# Patient Record
Sex: Male | Born: 1945
Health system: Southern US, Community
[De-identification: ages and names within clinical notes are randomized; demographics above are authoritative.]

## PROBLEM LIST (undated history)

## (undated) DIAGNOSIS — I4510 Unspecified right bundle-branch block: Secondary | ICD-10-CM

## (undated) DIAGNOSIS — C61 Malignant neoplasm of prostate: Secondary | ICD-10-CM

## (undated) DIAGNOSIS — K219 Gastro-esophageal reflux disease without esophagitis: Secondary | ICD-10-CM

## (undated) DIAGNOSIS — I10 Essential (primary) hypertension: Secondary | ICD-10-CM

## (undated) DIAGNOSIS — S42101A Fracture of unspecified part of scapula, right shoulder, initial encounter for closed fracture: Secondary | ICD-10-CM

## (undated) DIAGNOSIS — R1310 Dysphagia, unspecified: Secondary | ICD-10-CM

## (undated) DIAGNOSIS — G4730 Sleep apnea, unspecified: Secondary | ICD-10-CM

## (undated) DIAGNOSIS — R51 Headache: Secondary | ICD-10-CM

## (undated) DIAGNOSIS — R972 Elevated prostate specific antigen [PSA]: Secondary | ICD-10-CM

## (undated) DIAGNOSIS — M1990 Unspecified osteoarthritis, unspecified site: Secondary | ICD-10-CM

## (undated) DIAGNOSIS — L84 Corns and callosities: Secondary | ICD-10-CM

## (undated) DIAGNOSIS — R748 Abnormal levels of other serum enzymes: Secondary | ICD-10-CM

## (undated) DIAGNOSIS — E785 Hyperlipidemia, unspecified: Secondary | ICD-10-CM

## (undated) HISTORY — DX: Elevated prostate specific antigen (PSA): R97.20

## (undated) HISTORY — DX: Sleep apnea, unspecified: G47.30

## (undated) HISTORY — DX: Fracture of unspecified part of scapula, right shoulder, initial encounter for closed fracture: S42.101A

## (undated) HISTORY — DX: Abnormal levels of other serum enzymes: R74.8

## (undated) HISTORY — DX: Essential (primary) hypertension: I10

## (undated) HISTORY — DX: Corns and callosities: L84

## (undated) HISTORY — DX: Unspecified right bundle-branch block: I45.10

## (undated) HISTORY — PX: OTHER SURGICAL HISTORY: SHX169

## (undated) HISTORY — DX: Dysphagia, unspecified: R13.10

## (undated) HISTORY — DX: Hyperlipidemia, unspecified: E78.5

## (undated) HISTORY — DX: Headache: R51

## (undated) HISTORY — DX: Gastro-esophageal reflux disease without esophagitis: K21.9

## (undated) HISTORY — PX: PROSTATE BIOPSY: SHX241

---

## 1983-08-21 HISTORY — PX: OTHER SURGICAL HISTORY: SHX169

## 1993-08-20 HISTORY — PX: OTHER SURGICAL HISTORY: SHX169

## 1998-07-18 ENCOUNTER — Ambulatory Visit (HOSPITAL_COMMUNITY): Admission: RE | Admit: 1998-07-18 | Discharge: 1998-07-18 | Payer: Self-pay | Admitting: Orthopedic Surgery

## 2001-08-20 HISTORY — PX: OTHER SURGICAL HISTORY: SHX169

## 2002-04-07 ENCOUNTER — Encounter (INDEPENDENT_AMBULATORY_CARE_PROVIDER_SITE_OTHER): Payer: Self-pay | Admitting: *Deleted

## 2002-04-07 ENCOUNTER — Ambulatory Visit (HOSPITAL_COMMUNITY): Admission: RE | Admit: 2002-04-07 | Discharge: 2002-04-07 | Payer: Self-pay | Admitting: *Deleted

## 2005-11-02 ENCOUNTER — Ambulatory Visit (HOSPITAL_COMMUNITY): Admission: RE | Admit: 2005-11-02 | Discharge: 2005-11-02 | Payer: Self-pay | Admitting: *Deleted

## 2006-07-21 ENCOUNTER — Emergency Department (HOSPITAL_COMMUNITY): Admission: EM | Admit: 2006-07-21 | Discharge: 2006-07-21 | Payer: Self-pay | Admitting: Emergency Medicine

## 2006-07-21 HISTORY — PX: OTHER SURGICAL HISTORY: SHX169

## 2007-04-14 ENCOUNTER — Encounter: Admission: RE | Admit: 2007-04-14 | Discharge: 2007-04-14 | Payer: Self-pay | Admitting: *Deleted

## 2008-05-20 HISTORY — PX: OTHER SURGICAL HISTORY: SHX169

## 2011-01-05 NOTE — Op Note (Signed)
   NAME:  Charles Salas, Charles Salas                            ACCOUNT NO.:  [id number]   MEDICAL RECORD NO.:  [id number]                   PATIENT TYPE:  AMB   LOCATION:  ENDO                                 FACILITY:  [facility]   PHYSICIAN:  Georgiana Spinner, M.D.                 DATE OF BIRTH:  1946/03/24   DATE OF PROCEDURE:  DATE OF DISCHARGE:                                 OPERATIVE REPORT   PROCEDURE:  Colonoscopy.   INDICATIONS:  Colon polyp.   ANESTHESIA:  Demerol 80 mg, Versed 8 mg.   DESCRIPTION OF PROCEDURE:  With the patient mildly sedated in the left  lateral decubitus position, the rectal examination was performed.  Subsequently the Olympus videoscopic colonoscope was inserted in the rectum  and passed under direct vision to the cecum, identified by the ileocecal  valve and appendiceal orifice.  There was a questionable polyp seen in his  cecum, which was photographed and removed using hot biopsy forceps  technique, setting on ERBE.  The endoscope was then withdrawn, taking  circumferential views of the entire colonic mucosa, stopping only in the  rectum, which appeared normal on direct and showed hemorrhoids on retroflex  view.  The endoscope was straightened and withdrawn.  The patient's vital  signs and pulse oximetry remained stable.  The patient tolerated the  procedure well without apparent complications.   FINDINGS:  1. Question of polyp of cecum, removed.  2. Internal hemorrhoids.  3. There were diverticula seen in the sigmoid colon.   PLAN:  Await biopsy report.  The patient will call me for the results and  follow up with me as an outpatient.                                                Georgiana Spinner, M.D.    GMO/MEDQ  D:  04/07/2002  T:  04/08/2002  Job:  [id number]

## 2011-01-05 NOTE — Op Note (Signed)
NAME:  Charles Salas, Charles Salas NO.:  [id number]   MEDICAL RECORD NO.:  [id number]          PATIENT TYPE:  AMB   LOCATION:  ENDO                         FACILITY:  [facility]   PHYSICIAN:  Georgiana Spinner, M.D.    DATE OF BIRTH:  1946/05/07   DATE OF PROCEDURE:  11/02/2005  DATE OF DISCHARGE:                                 OPERATIVE REPORT   PROCEDURE:  Colonoscopy.   ENDOSCOPIST:  Georgiana Spinner, M.D.   INDICATIONS:  Colon polyps.   ANESTHESIA:  Demerol 80 mg, Versed 9 mg.   PROCEDURE:  With the patient mildly sedated in the left lateral decubitus  position, the Olympus videoscopic colonoscope was inserted in the rectum and  passed under direct vision after normal rectal examination to the cecum.  The cecum was identified by ileocecal valve and appendiceal orifice, both  which were photographed.  From this point, the colonoscope was slowly  withdrawn, taking circumferential views of the colonic mucosa, stopping in  the rectum, which appeared normal on direct and showed hemorrhoids on  retroflexed view.  The endoscope was straightened and withdrawn.  The  patient's vital signs and pulse oximetry remained stable.  The patient  tolerated the procedure well without apparent complications.   FINDINGS:  Internal hemorrhoids, otherwise an unremarkable colonoscopic  examination to the cecum.   PLAN:  Have the patient follow up with me in 5 years or as needed.           ______________________________  Georgiana Spinner, M.D.     GMO/MEDQ  D:  11/02/2005  T:  11/03/2005  Job:  [id number]

## 2011-01-05 NOTE — Consult Note (Signed)
NAME:  Charles Salas, Charles Salas NO.:  [id number]   MEDICAL RECORD NO.:  [id number]          PATIENT TYPE:  EMS   LOCATION:  ED                           FACILITY:  [facility]   PHYSICIAN:  Madelynn Done, MD  DATE OF BIRTH:  1945/12/07   DATE OF CONSULTATION:  07/21/2006  DATE OF DISCHARGE:                                 CONSULTATION   REQUESTING PHYSICIAN:  Dr. Effie Shy, emergency department.   REASON FOR CONSULTATION:  Right middle finger distal tip amputation.   HISTORY:  Mr. Puskas is a 65 year old left-hand-dominant gentleman who  sustained a crush injury to the distal tip of his left middle finger  when a garage door came down on his hand.  The patient was wearing a  glove.  The patient initially went to an urgent care and transferred  himself to the emergency department.  This patient has no other  injuries.  The patient received a tetanus shot 3 years ago for a  laceration to his thumb.   PAST MEDICAL HISTORY:  Denies any major medical illnesses.   PAST SURGICAL HISTORY:  Several knee surgeries and shoulder surgery by  Dr. Simonne Come.   MEDICATIONS:  Prilosec.   ALLERGIES:  None.   SOCIAL HISTORY:  He is a nonsmoker.  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  He is married and lives in [place].  He is an  active Armed forces operational officer.   REVIEW OF SYSTEMS:  No recent illnesses, hospitalizations.   PHYSICAL EXAMINATION:  GENERAL:  He is a healthy-appearing white male in  no acute distress.  VITAL SIGNS:  Reviewed, and he is afebrile.  Normal blood pressure and  heart rate.   On examination of the right hand, he has obvious deformity of the right  middle finger with injury to the distal tip.  There are no other  abnormalities in the right hand, normal muscle tone and sensation to  other digits to light touch.  He is able to flex his right middle finger  DIP joint, fully extends the finger.  Otherwise, his neuromotor sensory  function in the right  hand is normal.  His hand is well-perfused.  The  injury took off the nail plate as well as a small portion of the distal  pulp and phalanx.   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   PROCEDURE:  Treatment options were discussed with the patient, and the  patient elected to proceed with debridement of the skin, subcutaneous  tissue, and bone as well as closure with local advancement flaps of the  distal tip.  The wound was then thoroughly irrigated with 1 lateral of  saline solution with a small tincture of Betadine in a pulsatile  fashion.  After the wound was thoroughly irrigated, it was prepped with  Betadine and then sterilely draped.  Prior to the irrigation and  prepping, 10 mL of 1% lidocaine were then injected into the middle  finger to serve as a local digital block.  He tolerated this procedure  well.  A debridement of the skin and subcutaneous tissue and bone was  then carried out of the distal phalanx.  Just a small portion of the  bone was debrided enough so that the distal phalanx was not exposed.  Two local advancement flaps, both on the radial and ulnar side were then  made in order to close the defect volarly.  The skin was closed with 5-0  chromic suture.  The dog-ears were then trimmed with the 15 blade  scalpel to obtain nice closure of the distal tips.  There was no exposed  bone with good skin and soft tissue covering the nail matrix.  The  nailbed plate had been evulsed off, and a small sterile piece of the  suture package was then placed underneath the eponychial fold to protect  the underlying nail matrix.  Xeroform was then applied over the top of  the finger.  A sterile dressing was then applied.  A Coban wrap and a  small finger splint was then applied.  The patient tolerated the  procedure well.   PLAN:  The patient was given a prescription for Keflex to take for the  next 7 days and was given a prescription for Vicodin  for pain.  He was  given my card and a followup appointment to see me back in the office in  10 days.  He is to keep the bandage on until I see him back and keep it  clean and dry.  The patient's and his wife's questions are answered, and  we will see him back in the office in follow up.      Madelynn Done, MD  Electronically Signed     FWO/MEDQ  D:  07/21/2006  T:  07/22/2006  Job:  [id number]

## 2011-03-26 ENCOUNTER — Ambulatory Visit (HOSPITAL_BASED_OUTPATIENT_CLINIC_OR_DEPARTMENT_OTHER)
Admission: RE | Admit: 2011-03-26 | Discharge: 2011-03-26 | Disposition: A | Discharge status: Home / Self Care | Payer: BC Managed Care – PPO | Source: Ambulatory Visit | Attending: Orthopedic Surgery | Admitting: Orthopedic Surgery

## 2011-03-26 DIAGNOSIS — Z01812 Encounter for preprocedural laboratory examination: Secondary | ICD-10-CM | POA: Insufficient documentation

## 2011-03-26 DIAGNOSIS — M1710 Unilateral primary osteoarthritis, unspecified knee: Secondary | ICD-10-CM | POA: Insufficient documentation

## 2011-03-26 DIAGNOSIS — M23319 Other meniscus derangements, anterior horn of medial meniscus, unspecified knee: Secondary | ICD-10-CM | POA: Insufficient documentation

## 2011-03-26 DIAGNOSIS — Z01810 Encounter for preprocedural cardiovascular examination: Secondary | ICD-10-CM | POA: Insufficient documentation

## 2011-03-26 LAB — POCT HEMOGLOBIN-HEMACUE: Hemoglobin: 16 g/dL (ref 13.0–17.0)

## 2011-04-02 NOTE — Op Note (Signed)
NAME:  Charles Salas, Charles Salas NO.:  [id number]  MEDICAL RECORD NO.:  [id number]  LOCATION:                                 FACILITY:  PHYSICIAN:  Marlowe Kays, M.D.       DATE OF BIRTH:  DATE OF PROCEDURE:  03/26/2011 DATE OF DISCHARGE:                              OPERATIVE REPORT   PREOPERATIVE DIAGNOSES: 1. Recurrent tear of medial meniscus, right knee. 2. Osteoarthritis, medial compartment of right knee.  POSTOPERATIVE DIAGNOSES: 1. Recurrent tear of medial meniscus, right knee. 2. Osteoarthritis, medial compartment of right knee.  OPERATION:  Right knee arthroscopy with partial medial meniscectomy and shaving of the medial femoral condyle.  SURGEON:  Marlowe Kays, M.D.  ASSISTANT:  Nurse.  ANESTHESIA:  General.  PATHOLOGY AND JUSTIFICATION FOR PROCEDURE:  I performed a partial medial meniscectomy on him some 10 years ago.  He plays lot of tennis and does not remember any particular injury, but because of persistent pain and popping in the inner joint, went ahead and had an MRI performed on Dec 23, 2010, which demonstrated the preoperative diagnoses.  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  PROCEDURE:  Satisfied general anesthesia, Ace wrap and knee support to left lower extremity, pneumatic tourniquet applied to right lower extremity with the leg Esmarched out non-sterilely and tourniquet inflated to 3 mmHg thigh, thigh stabilizer applied, right leg was then prepped with DuraPrep, stabilizer, and ankle drape in sterile field. Time-out performed.  Superior medial saline inflow.  First, an anterolateral portal and evaluated medial compartment of the knee joint. 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  The two did appear to be spatially  related.  I debrided down the anterior third of the medial meniscus and removed the fibrotic associated synovium. Also, gently smoothed down the medial femoral condyle.  Posteriorly, he had tearing of the medial meniscus all way from just proximal to posterior curve all way into the intercondylar area, which I trimmed back to stable rim with baskets and a 3.5 shaver.  Then, reversed portals, ACL was intact.  Lateral compartment of the knee joint looked normal.  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  The knee joint was then irrigated until clear and all fluid possibly removed.  I closed the 2 anterior portals with 4-0 nylon and then injected through the inflow apparatus 20 cc of 0.5% with adrenaline, 4 mg of morphine, closing this portal with 4-0 nylon as well.  Betadine, Adaptic, and dry sterile dressing were applied.  Tourniquet was released.  He tolerated the procedure well.  At the time of this dictation, was on his way to recovery room in satisfactory condition with no known complications.          ______________________________ Marlowe Kays, M.D.     JA/MEDQ  D:  03/26/2011  T:  03/27/2011  Job:  [id number]  Electronically Signed by Marlowe Kays M.D. on 04/02/2011 01:11:46 PM

## 2011-05-12 ENCOUNTER — Emergency Department (HOSPITAL_COMMUNITY): Payer: BC Managed Care – PPO

## 2011-05-12 ENCOUNTER — Emergency Department (HOSPITAL_COMMUNITY)
Admission: EM | Admit: 2011-05-12 | Discharge: 2011-05-12 | Disposition: A | Discharge status: Home / Self Care | Payer: BC Managed Care – PPO | Attending: Emergency Medicine | Admitting: Emergency Medicine

## 2011-05-12 DIAGNOSIS — Y9373 Activity, racquet and hand sports: Secondary | ICD-10-CM | POA: Insufficient documentation

## 2011-05-12 DIAGNOSIS — R110 Nausea: Secondary | ICD-10-CM | POA: Insufficient documentation

## 2011-05-12 DIAGNOSIS — S42109A Fracture of unspecified part of scapula, unspecified shoulder, initial encounter for closed fracture: Secondary | ICD-10-CM | POA: Insufficient documentation

## 2011-05-12 DIAGNOSIS — M25519 Pain in unspecified shoulder: Secondary | ICD-10-CM | POA: Insufficient documentation

## 2011-05-12 DIAGNOSIS — K219 Gastro-esophageal reflux disease without esophagitis: Secondary | ICD-10-CM | POA: Insufficient documentation

## 2011-05-12 DIAGNOSIS — W1809XA Striking against other object with subsequent fall, initial encounter: Secondary | ICD-10-CM | POA: Insufficient documentation

## 2011-05-12 DIAGNOSIS — IMO0002 Reserved for concepts with insufficient information to code with codable children: Secondary | ICD-10-CM | POA: Insufficient documentation

## 2011-05-18 ENCOUNTER — Other Ambulatory Visit: Payer: Self-pay | Admitting: Orthopedic Surgery

## 2011-05-18 DIAGNOSIS — M25511 Pain in right shoulder: Secondary | ICD-10-CM

## 2011-05-22 ENCOUNTER — Ambulatory Visit
Admission: RE | Admit: 2011-05-22 | Discharge: 2011-05-22 | Disposition: A | Discharge status: Home / Self Care | Payer: BC Managed Care – PPO | Source: Ambulatory Visit | Attending: Orthopedic Surgery | Admitting: Orthopedic Surgery

## 2011-05-22 DIAGNOSIS — M25511 Pain in right shoulder: Secondary | ICD-10-CM

## 2011-08-21 DIAGNOSIS — S42101A Fracture of unspecified part of scapula, right shoulder, initial encounter for closed fracture: Secondary | ICD-10-CM

## 2011-08-21 HISTORY — DX: Fracture of unspecified part of scapula, right shoulder, initial encounter for closed fracture: S42.101A

## 2012-03-31 ENCOUNTER — Encounter: Payer: Self-pay | Admitting: *Deleted

## 2012-03-31 ENCOUNTER — Encounter: Payer: Self-pay | Admitting: Pulmonary Disease

## 2012-04-01 ENCOUNTER — Encounter: Payer: Self-pay | Admitting: Pulmonary Disease

## 2012-04-01 ENCOUNTER — Ambulatory Visit (INDEPENDENT_AMBULATORY_CARE_PROVIDER_SITE_OTHER): Payer: BC Managed Care – PPO | Admitting: Pulmonary Disease

## 2012-04-01 VITALS — BP 132/96 | HR 69 | Temp 98.5°F | Ht 72.0 in | Wt 194.0 lb

## 2012-04-01 DIAGNOSIS — G4733 Obstructive sleep apnea (adult) (pediatric): Secondary | ICD-10-CM | POA: Insufficient documentation

## 2012-04-01 DIAGNOSIS — R1310 Dysphagia, unspecified: Secondary | ICD-10-CM | POA: Insufficient documentation

## 2012-04-01 NOTE — Progress Notes (Signed)
  Subjective:    Patient ID: Charles Salas, male    DOB: August 15, 1946, 66 y.o.   MRN: [id number]  HPI The patient is a 66 year old male who had been asked to see for management of obstructive sleep apnea.  He recently underwent sleep testing in May of this year, which showed an AHI of 34 events per hour.  The patient has been told that he has loud snoring according to his wife, as well as an abnormal breathing pattern during sleep.  [place], and his symptoms were much worse.  The patient has 1-2 awakenings at night, and is not rested in the mornings upon arising.  He does describe some sleep pressure with periods of inactivity such as watching television, but primarily is not satisfied with his alertness and focus during the day.  He denies any issues with sleepiness while driving.  The patient states that his weight is neutral over the last 2 years, and his Epworth score today is normal at 4.  Sleep Questionnaire: What time do you typically go to bed?( Between what hours) 10-11pm How long does it take you to fall asleep? 10-15 minutes How many times during the night do you wake up? 2 What time do you get out of bed to start your day? 0630 Do you drive or operate heavy machinery in your occupation? No How much has your weight changed (up or down) over the past two years? (In pounds) 3 lb (1.361 kg) Have you ever had a sleep study before? Yes If yes, location of study? [facility] If yes, date of study? Do you currently use CPAP? No Do you wear oxygen at any time? No     Review of Systems  Constitutional: Negative for fever and unexpected weight change.  HENT: Positive for congestion. Negative for ear pain, nosebleeds, sore throat, rhinorrhea, sneezing, trouble swallowing, dental problem, postnasal drip and sinus pressure.   Eyes: Negative for redness and itching.  Respiratory: Negative for cough, chest tightness,  shortness of breath and wheezing.   Cardiovascular: Negative for palpitations and leg swelling.  Gastrointestinal: Negative for nausea and vomiting.       Heartburn   Genitourinary: Negative for dysuria.  Musculoskeletal: Positive for arthralgias. Negative for joint swelling.  Skin: Negative for rash.  Neurological: Negative for headaches.  Hematological: Does not bruise/bleed easily.  Psychiatric/Behavioral: Negative for dysphoric mood. The patient is not nervous/anxious.   All other systems reviewed and are negative.       Objective:   Physical Exam Constitutional:  Well developed, no acute distress  HENT:  Nares patent without discharge  Oropharynx without exudate, palate and uvula are mildly elongated.   Eyes:  Perrla, eomi, no scleral icterus  Neck:  No JVD, no TMG  Cardiovascular:  Normal rate, regular rhythm, no rubs or gallops.  No murmurs        Intact distal pulses  Pulmonary :  Normal breath sounds, no stridor or respiratory distress   No rales, rhonchi, or wheezing  Abdominal:  Soft, nondistended, bowel sounds present.  No tenderness noted.   Musculoskeletal:  No lower extremity edema noted.  Lymph Nodes:  No cervical lymphadenopathy noted  Skin:  No cyanosis noted  Neurologic:  Alert, appropriate, moves all 4 extremities without obvious deficit.         Assessment & Plan:

## 2012-04-01 NOTE — Patient Instructions (Addendum)
Will start on cpap at moderate pressure level.  Please call if having issues with tolerance. Work on modest weight loss followup with me in 6 weeks.

## 2012-04-01 NOTE — Assessment & Plan Note (Signed)
The patient has been found to have moderate to severe obstructive sleep apnea by sleep testing, and he also notes sleep disruption and definite sleep pressure during the day.  I have had a long discussion with him about the pathophysiology of sleep apnea, including its impact to his quality of life and cardiovascular health.  At this point, his best treatment option would be CPAP as well as modest weight loss.  He is willing to give this a try. I will set the patient up on cpap at a moderate pressure level to allow for desensitization, and will troubleshoot the device over the next 4-6weeks if needed.  The pt is to call me if having issues with tolerance.  Will then optimize the pressure once patient is able to wear cpap on a consistent basis.

## 2012-05-13 ENCOUNTER — Ambulatory Visit (INDEPENDENT_AMBULATORY_CARE_PROVIDER_SITE_OTHER): Payer: BC Managed Care – PPO | Admitting: Pulmonary Disease

## 2012-05-13 ENCOUNTER — Encounter: Payer: Self-pay | Admitting: Pulmonary Disease

## 2012-05-13 ENCOUNTER — Ambulatory Visit: Payer: BC Managed Care – PPO | Admitting: Pulmonary Disease

## 2012-05-13 VITALS — BP 118/72 | HR 59 | Temp 98.5°F | Ht 72.0 in | Wt 201.4 lb

## 2012-05-13 DIAGNOSIS — G4733 Obstructive sleep apnea (adult) (pediatric): Secondary | ICD-10-CM

## 2012-05-13 NOTE — Progress Notes (Signed)
  Subjective:    Patient ID: Charles Salas, male    DOB: [birth year], 66 y.o.   MRN: [id number]  HPI The patient comes in today for followup of his known obstructive sleep apnea.  He was started on CPAP at the last visit had a moderate pressure level, and has done fairly well with the device.  He is having some mask leaking issues, and is going to try something different this week.  He feels that he is sleeping better, dreaming more, and feels more rested the next day.  He is having no issues with the CPAP pressure.   Review of Systems  Constitutional: Negative for fever and unexpected weight change.  HENT: Negative for ear pain, nosebleeds, congestion, sore throat, rhinorrhea, sneezing, trouble swallowing, dental problem, postnasal drip and sinus pressure.   Eyes: Negative for redness and itching.  Respiratory: Negative for cough, chest tightness, shortness of breath and wheezing.   Cardiovascular: Negative for palpitations and leg swelling.  Gastrointestinal: Negative for nausea and vomiting.  Genitourinary: Negative for dysuria.  Musculoskeletal: Negative for joint swelling.  Skin: Negative for rash.  Neurological: Negative for headaches.  Hematological: Does not bruise/bleed easily.  Psychiatric/Behavioral: Negative for dysphoric mood. The patient is not nervous/anxious.        Objective:   Physical Exam Well-developed male in no acute distress No skin breakdown or pressure necrosis from the CPAP mask Lower extremities without edema, cyanosis Alert and oriented, does not appear to be sleepy, moves all 4 extremities.       Assessment & Plan:

## 2012-05-13 NOTE — Assessment & Plan Note (Signed)
The patient is doing better since being on CPAP, but is having mask issues that will need to be resolved.  I have asked him to continue with his CPAP, and will take this opportunity to optimize his pressure on the automatic setting.  He will also work on mask fit.  I also encouraged him to work aggressively on weight loss.

## 2012-05-13 NOTE — Patient Instructions (Addendum)
Will send an order to your dme to put your machine on auto to optimize your pressure.  Will let you know the results. Work on [employer] loss Keep working on mask fit followup with me in 6mos.

## 2012-07-13 ENCOUNTER — Other Ambulatory Visit: Payer: Self-pay | Admitting: Pulmonary Disease

## 2012-07-13 DIAGNOSIS — G4733 Obstructive sleep apnea (adult) (pediatric): Secondary | ICD-10-CM

## 2012-11-10 ENCOUNTER — Ambulatory Visit (INDEPENDENT_AMBULATORY_CARE_PROVIDER_SITE_OTHER): Payer: BC Managed Care – PPO | Admitting: Pulmonary Disease

## 2012-11-10 ENCOUNTER — Encounter: Payer: Self-pay | Admitting: Pulmonary Disease

## 2012-11-10 VITALS — BP 142/84 | HR 62 | Temp 97.7°F | Ht 72.0 in | Wt 204.0 lb

## 2012-11-10 DIAGNOSIS — G4733 Obstructive sleep apnea (adult) (pediatric): Secondary | ICD-10-CM

## 2012-11-10 NOTE — Patient Instructions (Addendum)
Continue with cpap, and keep up with mask changes and supplies Work on modest weight loss followup with me in one year if doing well.

## 2012-11-10 NOTE — Progress Notes (Signed)
  Subjective:    Patient ID: Charles Salas, male    DOB: 26-Jan-1946, 67 y.o.   MRN: [id number]  HPI The patient comes in today for followup of his obstructive sleep apnea.  He is wearing CPAP compliantly and has optimized pressure, and feels that he is doing well with the device.  He clearly sees a difference in his sleep and daytime alertness versus the occasional night that he does not wear the device.  He is having no significant mask or pressure issues.  Of note, his weight is up 3 pounds since last visit.   Review of Systems  Constitutional: Negative for fever and unexpected weight change.  HENT: Positive for congestion. Negative for ear pain, nosebleeds, sore throat, rhinorrhea, sneezing, trouble swallowing, dental problem, postnasal drip and sinus pressure.   Eyes: Negative for redness and itching.  Respiratory: Negative for cough, chest tightness, shortness of breath and wheezing.   Cardiovascular: Negative for palpitations and leg swelling.  Gastrointestinal: Negative for nausea and vomiting.  Genitourinary: Negative for dysuria.  Musculoskeletal: Negative for joint swelling.  Skin: Negative for rash.  Neurological: Negative for headaches.  Hematological: Does not bruise/bleed easily.  Psychiatric/Behavioral: Negative for dysphoric mood. The patient is not nervous/anxious.        Objective:   Physical Exam Well-developed male in no acute distress Nose with purulent discharge noted No skin breakdown or pressure necrosis from the CPAP mask Lower extremities without edema, no cyanosis Alert and oriented, does not appear to be sleepy, moves all 4 extremities.       Assessment & Plan:

## 2012-11-10 NOTE — Assessment & Plan Note (Signed)
The patient is doing fairly well with CPAP optimal pressure.  He feels that his sleep and daytime alertness are definitely improved by the device.  I've asked him to keep up with this mask changes and supplies, and to work on modest weight loss.  If he is doing well, I would like to see him back in one year.

## 2012-12-15 ENCOUNTER — Other Ambulatory Visit: Payer: BC Managed Care – PPO

## 2012-12-15 ENCOUNTER — Other Ambulatory Visit: Payer: Self-pay | Admitting: *Deleted

## 2012-12-15 DIAGNOSIS — Z125 Encounter for screening for malignant neoplasm of prostate: Secondary | ICD-10-CM

## 2012-12-15 DIAGNOSIS — E785 Hyperlipidemia, unspecified: Secondary | ICD-10-CM

## 2012-12-15 DIAGNOSIS — M25569 Pain in unspecified knee: Secondary | ICD-10-CM

## 2012-12-15 DIAGNOSIS — K2100 Gastro-esophageal reflux disease with esophagitis, without bleeding: Secondary | ICD-10-CM

## 2012-12-16 LAB — COMPREHENSIVE METABOLIC PANEL
ALT: 15 IU/L (ref 0–44)
AST: 25 IU/L (ref 0–40)
Albumin: 4.5 g/dL (ref 3.6–4.8)
BUN: 12 mg/dL (ref 8–27)
CO2: 24 mmol/L (ref 19–28)
Chloride: 102 mmol/L (ref 97–108)
GFR calc Af Amer: 77 mL/min/{1.73_m2} (ref >59)
GFR calc non Af Amer: 66 mL/min/{1.73_m2} (ref >59)
Globulin, Total: 2 g/dL (ref 1.5–4.5)
Glucose: 86 mg/dL (ref 65–99)
Sodium: 140 mmol/L (ref 134–144)

## 2012-12-16 LAB — CBC WITH DIFFERENTIAL/PLATELET
Basos: 1 % (ref 0–3)
Immature Grans (Abs): 0 10*3/uL (ref 0.0–0.1)
MCH: 32.2 pg (ref 26.6–33.0)
MCHC: 34.1 g/dL (ref 31.5–35.7)
Monocytes Absolute: 0.3 10*3/uL (ref 0.1–0.9)
Monocytes: 8 % (ref 4–12)
Neutrophils Absolute: 1.8 10*3/uL (ref 1.4–7.0)
Neutrophils Relative %: 54 % (ref 40–74)
RBC: 4.91 x10E6/uL (ref 4.14–5.80)
RDW: 14 % (ref 12.3–15.4)
WBC: 3.4 10*3/uL (ref 3.4–10.8)

## 2012-12-16 LAB — LIPID PANEL
Chol/HDL Ratio: 3.5 ratio units (ref 0.0–5.0)
Cholesterol, Total: 187 mg/dL (ref 100–199)

## 2012-12-17 ENCOUNTER — Encounter: Payer: Self-pay | Admitting: Internal Medicine

## 2012-12-17 ENCOUNTER — Encounter: Payer: Self-pay | Admitting: Geriatric Medicine

## 2012-12-17 ENCOUNTER — Ambulatory Visit (INDEPENDENT_AMBULATORY_CARE_PROVIDER_SITE_OTHER): Payer: BC Managed Care – PPO | Admitting: Internal Medicine

## 2012-12-17 VITALS — BP 112/78 | HR 63 | Temp 98.2°F | Resp 16 | Ht 72.0 in | Wt 191.0 lb

## 2012-12-17 DIAGNOSIS — G4733 Obstructive sleep apnea (adult) (pediatric): Secondary | ICD-10-CM

## 2012-12-17 DIAGNOSIS — R1310 Dysphagia, unspecified: Secondary | ICD-10-CM

## 2012-12-17 DIAGNOSIS — K219 Gastro-esophageal reflux disease without esophagitis: Secondary | ICD-10-CM

## 2012-12-17 DIAGNOSIS — E785 Hyperlipidemia, unspecified: Secondary | ICD-10-CM

## 2012-12-17 MED ORDER — VITAMIN D 1000 UNITS PO CAPS: 1000.0000 [IU] | ORAL_CAPSULE | Freq: Every day | ORAL | Status: DC

## 2012-12-17 NOTE — Patient Instructions (Signed)
Continue current medications. 

## 2013-03-01 ENCOUNTER — Encounter: Payer: Self-pay | Admitting: Internal Medicine

## 2013-03-01 DIAGNOSIS — K219 Gastro-esophageal reflux disease without esophagitis: Secondary | ICD-10-CM | POA: Insufficient documentation

## 2013-03-01 DIAGNOSIS — G4733 Obstructive sleep apnea (adult) (pediatric): Secondary | ICD-10-CM | POA: Insufficient documentation

## 2013-03-01 DIAGNOSIS — G4730 Sleep apnea, unspecified: Secondary | ICD-10-CM | POA: Insufficient documentation

## 2013-03-01 DIAGNOSIS — E785 Hyperlipidemia, unspecified: Secondary | ICD-10-CM | POA: Insufficient documentation

## 2013-03-01 NOTE — Progress Notes (Signed)
Date: 03/01/2013  MRN:  [id number] Name:  Charles Salas Sex:  male Age:  67 y.o. DOB:03-28-1946                  Level Of Care: independent Provider: Murray Hodgkins, MD  Emergency Contacts: Contact Information   Name Relation Home Work Mobile   Asiah, [place]ef[place] [phone]  [phone]      Code Status: full  Allergies: Allergies  Allergen Reactions  . Augmentin (Amoxicillin-Pot Clavulanate)      Chief Complaint  Patient presents with  . Annual Exam    no new problems     HPI: GERD is better. Using Prilosec. Has successfully lost weight. Using Weight Watcher program. Remains active. Plays tennis regularly. Using CPAP for his OSA that was confirmed on sleep study 12/31/11  Past Medical History  Diagnosis Date  . Dysphagia   . Sleep apnea   . Obstructive sleep apnea (adult) (pediatric)   . Special screening for malignant neoplasm of prostate   . Other symptoms involving respiratory system and chest   . Pain in joint, lower leg   . Corns and callosities   . Residual foreign body in soft tissue   . Headache(784.0)   . Hyperlipidemia   . GERD (gastroesophageal reflux disease)   . Diaphragmatic hernia without mention of obstruction or gangrene   . Fracture of scapula, right, closed 2013    Past Surgical History  Procedure Laterality Date  . 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 Melvyn Novas  . Left shoulder surgery Left 2003    shaved collar bone; Dr. Simonne Come  . Left knee arthroscopy Left 1985    Dr. Simonne Come  . Right knee arthroscopy Right 1009    Dr. Simonne Come  . Left knee arthroscopy Left 1995    Dr. Simonne Come     Procedures: 1976 CT brain: normal. 2000 EGD Virginia Rochester): Scar of distal esophagus; history of dysphagia 04-07-2002 Colonoscopy-polyps of cecum, internal hemorrhoids, diverticula-Dr. Virginia Rochester 11-02-2005 Colonoscopy-internal hemorrhoids-Dr. Virginia [facility] 07-21-2006 X-ray right middle finger-traumatic amputation involving the distal tuft  of the distal phalanx 10/25/2009 CT of the Brain: Normal nonenhanced CT scan of the brain   12/31/11 Sleep study: confirmed OSA.  Consultants: Dr. Harold Barban Dr.[facility]  Dr. Melvyn Novas: Ortho  Current Outpatient Prescriptions  Medication Sig Dispense Refill  . Multiple Vitamins-Minerals (MULTIVITAMIN WITH MINERALS) tablet Take 1 tablet by mouth daily.      . Omega-3 Fatty Acids (FISH OIL PO) Take 1,800 mg by mouth daily.      Marland Kitchen omeprazole (PRILOSEC) 20 MG capsule Take 20 mg by mouth daily.      . Thiamine HCl (VITAMIN B-1) 250 MG tablet Take 250 mg by mouth daily.      . Cholecalciferol (VITAMIN D) 1000 UNITS capsule Take 1 capsule (1,000 Units total) by mouth daily.       No current facility-administered medications for this visit.    Immunization History  Administered Date(s) Administered  . Influenza Split 05/21/2011, 06/20/2012  . Zoster 05/21/2011     Diet: Regular  History  Substance Use Topics  . Smoking status: Never Smoker   . Smokeless tobacco: Not on file  . Alcohol Use: Yes     Comment: 5-6 drinks per week    Family History  Problem Relation Age of Onset  . Prostate cancer Father   . Diabetes Father   . Diabetes Mother   . Stroke Mother     Review of systems DATA  OBTAINED: from patient GENERAL: Feels well   No fevers, fatigue, change in appetite or weight SKIN: No itch, rash or open wounds EYES: No eye pain, dryness or itching  No change in vision EARS: No earache, tinnitus, change in hearing NOSE: No congestion, drainage or bleeding MOUTH/THROAT: No mouth or tooth pain  No sore throat No difficulty chewing or swallowing RESPIRATORY: No cough, wheezing, SOB CARDIAC: No chest pain, palpitations  No edema. CHEST/BREASTS: No discomfort, discharge or lumps in breasts GI: No abdominal pain  No N/V/D or constipation  No heartburn or reflux  GU: No dysuria, frequency or urgency  No change in urine volume or character No nocturia or  change in stream   MUSCULOSKELETAL: No joint pain, swelling or stiffness  No back pain  No muscle ache, pain, weakness  Gait is steady  No recent falls. Bilateral Dupuytrens contracture. NEUROLOGIC: No dizziness, fainting, headache, imbalance, numbness  No change in mental status.  PSYCHIATRIC: No feelings of anxiety, depression Sleeps well.  No behavior issue.    Vital signs: BP 112/78  Pulse 63  Temp(Src) 98.2 F (36.8 C) (Oral)  Resp 16  Ht 6' (1.829 m)  Wt 191 lb (86.637 kg)  BMI 25.9 kg/m2  SpO2 99%  GENERAL APPEARANCE: No acute distress, appropriately groomed, normal body habitus. Alert, pleasant, conversant. SKIN: No diaphoresis rash, unusual lesions, wounds HEAD: Normocephalic, atraumatic EYES: Conjunctiva/lids clear. Pupils round, reactive. EOMs intact.  EARS: External exam WNL, canals clear, TM WNL. Hearing grossly normal. NOSE: No deformity or discharge. MOUTH/THROAT: Lips w/o lesions. Oral mucosa, tongue moist, w/o lesion. Oropharynx w/o redness or lesions.  NECK: Supple, full ROM. No thyroid tenderness, enlargement or nodule LYMPHATICS: No head, neck or supraclavicular adenopathy RESPIRATORY: Breathing is even, unlabored. Lung sounds are clear and full.  CARDIOVASCULAR: Heart RRR. No murmur or extra heart sounds  ARTERIAL: No carotid, aortic or femoral bruit. Carotid, Femoral, Popliteal, DP,PT pulse 2+.  VENOUS: No varicosities. No venous stasis skin changes  EDEMA: No peripheral or periorbital edema. No ascites GASTROINTESTINAL: Abdomen is soft, non-tender, not distended w/ normal bowel sounds. No hepatic or splenic enlargement. No mass, ventral or inguinal hernia.  RECTAL: No anal fissure, skin tag or external hemorrhoid. Sphincter tone WNL. Stool is brown, heme negative. Prostate normal. GENITOURINARY: Bladder non tender, not distended.  MALE: No penile lesion or drainage. No scrotal edema, tenderness or mass. MUSCULOSKELETAL: Moves all extremities with full ROM,  strength and tone. Back is without kyphosis, scoliosis or spinal process tenderness. Gait is steady NEUROLOGIC: Oriented to time, place, person. Cranial nerves 2-12 grossly intact, speech clear, no tremor. Patella, brachial DTR 2+. PSYCHIATRIC: Mood and affect appropriate to situation    Lab reports 12/07/2010 CBC Wbc 3.0 Rbc 4.75 Hemoglobin 15.1  CMP Glucose 81 Bun 12 creatinine 0.95  Lipid Panel Cholesterol 171 Triglycerides 89 Hdl 53 Ldl 100  HA1C 5.5  PSA 3.7  TSH 1.310  Vitamin D 25 Hydroxy 45.7  11/08/2011 CBC; WBC 3.6, RBC 4.59, HGB 14.7 CMP; Glucose 79, BUN 12, Creatinine 0.92 Lipid Panel; Cholesterol 181, Triglycerides 101, LDL 112, HDL 49, TSH 1.850, Vit D 31.5 Appointment on 12/15/2012  Component Date Value Range Status  . Glucose 12/15/2012 86  65 - 99 mg/dL Final  . BUN 16/05/9603 12  8 - 27 mg/dL Final  . Creatinine, Ser 12/15/2012 1.16  0.76 - 1.27 mg/dL Final  . GFR calc non Af Amer 12/15/2012 66  >59 mL/min/1.73 Final  . GFR calc Af Denyse Dago 12/15/2012  77  >59 mL/min/1.73 Final  . BUN/Creatinine Ratio 12/15/2012 10  10 - 22 Final  . Sodium 12/15/2012 140  134 - 144 mmol/L Final  . Potassium 12/15/2012 4.8  3.5 - 5.2 mmol/L Final  . Chloride 12/15/2012 102  97 - 108 mmol/L Final  . CO2 12/15/2012 24  19 - 28 mmol/L Final  . Calcium 12/15/2012 10.0  8.6 - 10.2 mg/dL Final  . Total Protein 12/15/2012 6.5  6.0 - 8.5 g/dL Final  . Albumin 40/98/1191 4.5  3.6 - 4.8 g/dL Final  . Globulin, Total 12/15/2012 2.0  1.5 - 4.5 g/dL Final  . Albumin/Globulin Ratio 12/15/2012 2.3  1.1 - 2.5 Final  . Total Bilirubin 12/15/2012 0.5  0.0 - 1.2 mg/dL Final  . Alkaline Phosphatase 12/15/2012 73  39 - 117 IU/L Final  . AST 12/15/2012 25  0 - 40 IU/L Final  . ALT 12/15/2012 15  0 - 44 IU/L Final  . Cholesterol, Total 12/15/2012 187  100 - 199 mg/dL Final  . Triglycerides 12/15/2012 98  0 - 149 mg/dL Final  . HDL 47/82/9562 54  >39 mg/dL Final   Comment: According to ATP-III Guidelines,  HDL-C >59 mg/dL is considered a                          negative risk factor for CHD.  Marland Kitchen VLDL Cholesterol Cal 12/15/2012 20  5 - 40 mg/dL Final  . LDL Calculated 12/15/2012 130* 0 - 99 mg/dL Final  . Chol/HDL Ratio 12/15/2012 3.5  0.0 - 5.0 ratio units Final  . PSA 12/15/2012 3.9  0.0 - 4.0 ng/mL Final   Comment: Roche ECLIA methodology.                          According to the American Urological Association, Serum PSA should                          decrease and remain at undetectable levels after radical                          prostatectomy. The AUA defines biochemical recurrence as an initial                          PSA value 0.2 ng/mL or greater followed by a subsequent confirmatory                          PSA value 0.2 ng/mL or greater.                          Values obtained with different assay methods or kits cannot be used                          interchangeably. Results cannot be interpreted as absolute evidence                          of the presence or absence of malignant disease.  . WBC 12/15/2012 3.4  3.4 - 10.8 x10E3/uL Final  . RBC 12/15/2012 4.91  4.14 - 5.80 x10E6/uL Final  . Hemoglobin 12/15/2012 15.8  12.6 - 17.7 g/dL Final  . HCT 86/57/8469  46.4  37.5 - 51.0 % Final  . MCV 12/15/2012 95  79 - 97 fL Final  . MCH 12/15/2012 32.2  26.6 - 33.0 pg Final  . MCHC 12/15/2012 34.1  31.5 - 35.7 g/dL Final  . RDW 16/05/9603 14.0  12.3 - 15.4 % Final  . Neutrophils Relative % 12/15/2012 54  40 - 74 % Final  . Lymphs 12/15/2012 36  14 - 46 % Final  . Monocytes 12/15/2012 8  4 - 12 % Final  . Eos 12/15/2012 1  0 - 5 % Final  . Basos 12/15/2012 1  0 - 3 % Final  . Neutrophils Absolute 12/15/2012 1.8  1.4 - 7.0 x10E3/uL Final  . Lymphocytes Absolute 12/15/2012 1.2  0.7 - 3.1 x10E3/uL Final  . Monocytes Absolute 12/15/2012 0.3  0.1 - 0.9 x10E3/uL Final  . Eosinophils Absolute 12/15/2012 0.0  0.0 - 0.4 x10E3/uL Final  . Basophils Absolute 12/15/2012 0.0  0.0 - 0.2  x10E3/uL Final  . Immature Granulocytes 12/15/2012 0  0 - 2 % Final  . Immature Grans (Abs) 12/15/2012 0.0  0.0 - 0.1 x10E3/uL Final     Screening Score  MMS    PHQ2 0  PHQ9     Fall Risk    BIMS    Annual summary:  Infection History: none Functional assessment: independent in all ADL Areas of potential improvement: functioning at highest acheivable level Rehabilitation Potential: not pertinent Prognosis for survival: good  Plan: Hyperlipidemia: controlled  GERD (gastroesophageal reflux disease): improved  OSA (obstructive sleep apnea): stable on CPAP  Dysphagia: resoloved

## 2013-06-23 ENCOUNTER — Encounter: Payer: Self-pay | Admitting: *Deleted

## 2013-12-16 ENCOUNTER — Other Ambulatory Visit: Payer: Self-pay | Admitting: *Deleted

## 2013-12-16 DIAGNOSIS — E785 Hyperlipidemia, unspecified: Secondary | ICD-10-CM

## 2013-12-16 DIAGNOSIS — Z0000 Encounter for general adult medical examination without abnormal findings: Secondary | ICD-10-CM

## 2013-12-17 ENCOUNTER — Other Ambulatory Visit: Payer: Commercial Managed Care - HMO

## 2013-12-17 DIAGNOSIS — E785 Hyperlipidemia, unspecified: Secondary | ICD-10-CM

## 2013-12-17 DIAGNOSIS — Z0000 Encounter for general adult medical examination without abnormal findings: Secondary | ICD-10-CM

## 2013-12-18 LAB — LIPID PANEL
Chol/HDL Ratio: 3.6 ratio units (ref 0.0–5.0)
Cholesterol, Total: 203 mg/dL — ABNORMAL HIGH (ref 100–199)
HDL: 56 mg/dL (ref >39)
LDL Calculated: 126 mg/dL — ABNORMAL HIGH (ref 0–99)
Triglycerides: 103 mg/dL (ref 0–149)
VLDL Cholesterol Cal: 21 mg/dL (ref 5–40)

## 2013-12-18 LAB — COMPREHENSIVE METABOLIC PANEL
ALK PHOS: 65 IU/L (ref 39–117)
ALT: 16 IU/L (ref 0–44)
AST: 25 IU/L (ref 0–40)
Albumin/Globulin Ratio: 2.4 (ref 1.1–2.5)
Albumin: 4.5 g/dL (ref 3.6–4.8)
BILIRUBIN TOTAL: 0.5 mg/dL (ref 0.0–1.2)
BUN / CREAT RATIO: 23 — AB (ref 10–22)
BUN: 24 mg/dL (ref 8–27)
CALCIUM: 9.5 mg/dL (ref 8.6–10.2)
CO2: 20 mmol/L (ref 18–29)
Chloride: 103 mmol/L (ref 97–108)
Creatinine, Ser: 1.05 mg/dL (ref 0.76–1.27)
GFR calc Af Amer: 86 mL/min/{1.73_m2} (ref >59)
GFR, EST NON AFRICAN AMERICAN: 74 mL/min/{1.73_m2} (ref >59)
Globulin, Total: 1.9 g/dL (ref 1.5–4.5)
Glucose: 88 mg/dL (ref 65–99)
POTASSIUM: 5.2 mmol/L (ref 3.5–5.2)
Sodium: 142 mmol/L (ref 134–144)
TOTAL PROTEIN: 6.4 g/dL (ref 6.0–8.5)

## 2013-12-18 LAB — CBC WITH DIFFERENTIAL/PLATELET
BASOS ABS: 0 10*3/uL (ref 0.0–0.2)
Basos: 1 %
EOS: 2 %
Eosinophils Absolute: 0.1 10*3/uL (ref 0.0–0.4)
HEMATOCRIT: 43.3 % (ref 37.5–51.0)
Hemoglobin: 15.2 g/dL (ref 12.6–17.7)
Immature Grans (Abs): 0 10*3/uL (ref 0.0–0.1)
Immature Granulocytes: 0 %
LYMPHS: 37 %
Lymphocytes Absolute: 1.3 10*3/uL (ref 0.7–3.1)
MCH: 32.8 pg (ref 26.6–33.0)
MCHC: 35.1 g/dL (ref 31.5–35.7)
MCV: 93 fL (ref 79–97)
MONOCYTES: 12 %
Monocytes Absolute: 0.4 10*3/uL (ref 0.1–0.9)
NEUTROS PCT: 48 %
Neutrophils Absolute: 1.7 10*3/uL (ref 1.4–7.0)
RBC: 4.64 x10E6/uL (ref 4.14–5.80)
RDW: 14 % (ref 12.3–15.4)
WBC: 3.6 10*3/uL (ref 3.4–10.8)

## 2013-12-18 LAB — PSA: PSA: 3.9 ng/mL (ref 0.0–4.0)

## 2013-12-23 ENCOUNTER — Encounter: Payer: Self-pay | Admitting: Internal Medicine

## 2013-12-23 ENCOUNTER — Ambulatory Visit (INDEPENDENT_AMBULATORY_CARE_PROVIDER_SITE_OTHER): Payer: Commercial Managed Care - HMO | Admitting: Internal Medicine

## 2013-12-23 VITALS — HR 70 | Temp 99.2°F | Resp 18 | Ht 72.64 in | Wt 193.2 lb

## 2013-12-23 DIAGNOSIS — E785 Hyperlipidemia, unspecified: Secondary | ICD-10-CM

## 2013-12-23 DIAGNOSIS — K219 Gastro-esophageal reflux disease without esophagitis: Secondary | ICD-10-CM

## 2013-12-23 DIAGNOSIS — R1310 Dysphagia, unspecified: Secondary | ICD-10-CM

## 2013-12-23 DIAGNOSIS — Z125 Encounter for screening for malignant neoplasm of prostate: Secondary | ICD-10-CM

## 2013-12-23 DIAGNOSIS — G4733 Obstructive sleep apnea (adult) (pediatric): Secondary | ICD-10-CM

## 2013-12-23 DIAGNOSIS — Z79899 Other long term (current) drug therapy: Secondary | ICD-10-CM

## 2013-12-23 DIAGNOSIS — J209 Acute bronchitis, unspecified: Secondary | ICD-10-CM | POA: Insufficient documentation

## 2013-12-23 MED ORDER — DOXYCYCLINE HYCLATE 100 MG PO TABS: ORAL_TABLET | ORAL | Status: DC

## 2013-12-23 NOTE — Progress Notes (Addendum)
Patient ID: Charles Salas, male   DOB: 1946/05/30, 68 y.o.   MRN: [id number]    Location:    [facility]  Place of Service:  OFFICE  PCP: Estill Dooms, MD  Code Status: Full  Extended Emergency Contact Information Primary Emergency Contact: Hagenow,Pamela E Address: [address]          [place], [place] [address] [place] Phone: [phone] Mobile Phone: [phone] Relation: Spouse  Allergies  Allergen Reactions  . Augmentin [Amoxicillin-Pot Clavulanate]     Chief Complaint  Patient presents with  . Annual Exam    HPI:  Presents today for his annual exam, but is also acutely ill with respiratory infection. Ran fever up to 101 last night. Coughing and feels exhausted.  Hyperlipidemia -controlled  Dysphagia: history of HH. Food seems to hang in throat sometimes.  GERD (gastroesophageal reflux disease): occ, heartburn. Using Tums prn  Obstructive sleep apnea (adult) (pediatric): continues on CPAP    Past Medical History  Diagnosis Date  . Dysphagia   . Sleep apnea   . Obstructive sleep apnea (adult) (pediatric)   . Special screening for malignant neoplasm of prostate   . Other symptoms involving respiratory system and chest   . Pain in joint, lower leg   . Corns and callosities   . Residual foreign body in soft tissue   . Headache(784.0)   . Hyperlipidemia   . GERD (gastroesophageal reflux disease)   . Diaphragmatic hernia without mention of obstruction or gangrene   . Fracture of scapula, right, closed 2013    Past Surgical History  Procedure Laterality Date  . 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 Caralyn Guile  . Left shoulder surgery Left 2003    shaved collar bone; Dr. Shellia Carwin  . Left knee arthroscopy Left 1985    Dr. Shellia Carwin  . Right knee arthroscopy Right 1009    Dr. Shellia Carwin  . Left knee arthroscopy Left 1995    Dr. Shellia Carwin    CONSULTANTS Dr.  Minda Meo Dr.[facility]  PAST PROCEDURES 1976 CT brain: normal. 2000 EGD Lajoyce Corners): Scar of distal esophagus; history of dysphagia 04-07-2002 Colonoscopy-polyps of cecum, internal hemorrhoids, diverticula-Dr. Lajoyce Corners 11-02-2005 Colonoscopy-internal hemorrhoids-Dr. Lajoyce Corners 07-21-2006 X-ray right middle finger-traumatic amputation involving the distal tuft of the distal phalanx 10/25/2009 CT of the Brain: Normal nonenhanced CT scan of the brain   05/18/11 CT shoulder:Fracture of the body the scapula extends inferior to the glenoid neck. The fracture does not involve the articular surface the glenoid and no other acute abnormality is identified.   Social History: History   Social History  . Marital Status: Married    Spouse Name: N/A    Number of Children: N/A  . Years of Education: N/A   Occupational History  . RETIRED MEDICAL SALES    Social History Main Topics  . Smoking status: Never Smoker   . Smokeless tobacco: None  . Alcohol Use: Yes     Comment: 5-6 drinks per week  . Drug Use: No  . Sexual Activity: None   Other Topics Concern  . None   Social History Narrative  . None    Family History Family Status  Relation Status Death Age  . Father Alive   . Mother Deceased 71  . Sister Alive   . Sister Alive   . Sister Alive    Family History  Problem Relation Age of Onset  . Prostate cancer Father   . Diabetes Father   .  Diabetes Mother   . Stroke Mother      Medications: Patient's Medications  New Prescriptions   No medications on file  Previous Medications   CHOLECALCIFEROL (VITAMIN D) 1000 UNITS CAPSULE    Take 1 capsule (1,000 Units total) by mouth daily.   MULTIPLE VITAMINS-MINERALS (MULTIVITAMIN WITH MINERALS) TABLET    Take 1 tablet by mouth daily.   OMEGA-3 FATTY ACIDS (FISH OIL PO)    Take 1,800 mg by mouth daily.   OMEPRAZOLE (PRILOSEC) 20 MG CAPSULE    Take 20 mg by mouth daily.   THIAMINE HCL (VITAMIN B-1) 250 MG TABLET    Take  250 mg by mouth daily.  Modified Medications   No medications on file  Discontinued Medications   No medications on file    Immunization History  Administered Date(s) Administered  . Influenza Split 05/21/2011, 06/20/2012  . Influenza Whole 06/16/2013  . Zoster 05/21/2011     Review of Systems  Constitutional: Positive for fever, chills, activity change and fatigue. Negative for diaphoresis, appetite change and unexpected weight change.  HENT: Positive for congestion, sinus pressure, sore throat and trouble swallowing. Negative for ear pain, hearing loss, mouth sores, nosebleeds, postnasal drip and rhinorrhea.   Eyes: Positive for visual disturbance (CORRECTIVE LENSES).  Respiratory: Positive for cough and chest tightness. Negative for choking, shortness of breath, wheezing and stridor.   Cardiovascular: Negative for chest pain, palpitations and leg swelling.  Gastrointestinal: Negative for nausea, abdominal pain, diarrhea, constipation and abdominal distention.  Endocrine: Negative.   Genitourinary: Negative.   Musculoskeletal:       Bilateral Dupuytren's  Skin: Negative.   Allergic/Immunologic: Negative.   Neurological: Negative.   Hematological: Negative.   Psychiatric/Behavioral: Negative.       Filed Vitals:   12/23/13 1526  Pulse: 70  Temp: 99.2 F (37.3 C)  TempSrc: Oral  Resp: 18  Height: 6' 0.64" (1.845 m)  Weight: 193 lb 3.2 oz (87.635 kg)  SpO2: 98%   Physical Exam  Constitutional: He is oriented to person, place, and time.  Flushed and acutely ill in appearance  HENT:  Head: Normocephalic and atraumatic.  Right Ear: External ear normal.  Left Ear: External ear normal.  Nose: Nose normal.  Mouth/Throat: Oropharynx is clear and moist. No oropharyngeal exudate.  Eyes: Conjunctivae and EOM are normal. Pupils are equal, round, and reactive to light.  Neck: No JVD present. No tracheal deviation present. No thyromegaly present.  Cardiovascular: Normal  rate, normal heart sounds and intact distal pulses.  Exam reveals no gallop and no friction rub.   No murmur heard. Respiratory: No respiratory distress. He has no wheezes. He has no rales. He exhibits no tenderness.  coughing  Genitourinary: Rectum normal, prostate normal and penis normal. Guaiac negative stool. No penile tenderness.  Musculoskeletal: He exhibits edema. He exhibits no tenderness.  Lymphadenopathy:    He has no cervical adenopathy.  Neurological: He is alert and oriented to person, place, and time. He has normal reflexes. No cranial nerve deficit. Coordination normal.  Skin: No rash noted. No erythema. No pallor.  Psychiatric: He has a normal mood and affect. Judgment and thought content normal.       Labs reviewed: Appointment on 12/17/2013  Component Date Value Ref Range Status  . Glucose 12/17/2013 88  65 - 99 mg/dL Final  . BUN 12/17/2013 24  8 - 27 mg/dL Final  . Creatinine, Ser 12/17/2013 1.05  0.76 - 1.27 mg/dL Final  . GFR calc non  Af Amer 12/17/2013 74  >59 mL/min/1.73 Final  . GFR calc Af Amer 12/17/2013 86  >59 mL/min/1.73 Final  . BUN/Creatinine Ratio 12/17/2013 23* 10 - 22 Final  . Sodium 12/17/2013 142  134 - 144 mmol/L Final  . Potassium 12/17/2013 5.2  3.5 - 5.2 mmol/L Final  . Chloride 12/17/2013 103  97 - 108 mmol/L Final  . CO2 12/17/2013 20  18 - 29 mmol/L Final  . Calcium 12/17/2013 9.5  8.6 - 10.2 mg/dL Final  . Total Protein 12/17/2013 6.4  6.0 - 8.5 g/dL Final  . Albumin 12/17/2013 4.5  3.6 - 4.8 g/dL Final  . Globulin, Total 12/17/2013 1.9  1.5 - 4.5 g/dL Final  . Albumin/Globulin Ratio 12/17/2013 2.4  1.1 - 2.5 Final  . Total Bilirubin 12/17/2013 0.5  0.0 - 1.2 mg/dL Final  . Alkaline Phosphatase 12/17/2013 65  39 - 117 IU/L Final  . AST 12/17/2013 25  0 - 40 IU/L Final  . ALT 12/17/2013 16  0 - 44 IU/L Final  . Cholesterol, Total 12/17/2013 203* 100 - 199 mg/dL Final  . Triglycerides 12/17/2013 103  0 - 149 mg/dL Final  . HDL  12/17/2013 56  >39 mg/dL Final   Comment: According to ATP-III Guidelines, HDL-C >59 mg/dL is considered a                          negative risk factor for CHD.  Marland Kitchen VLDL Cholesterol Cal 12/17/2013 21  5 - 40 mg/dL Final  . LDL Calculated 12/17/2013 126* 0 - 99 mg/dL Final  . Chol/HDL Ratio 12/17/2013 3.6  0.0 - 5.0 ratio units Final   Comment:                                   T. Chol/HDL Ratio                                                                      Men  Women                                                        1/2 Avg.Risk  3.4    3.3                                                            Avg.Risk  5.0    4.4                                                         2X Avg.Risk  9.6    7.1  3X Avg.Risk 23.4   11.0  . WBC 12/17/2013 3.6  3.4 - 10.8 x10E3/uL Final  . RBC 12/17/2013 4.64  4.14 - 5.80 x10E6/uL Final  . Hemoglobin 12/17/2013 15.2  12.6 - 17.7 g/dL Final  . HCT 12/17/2013 43.3  37.5 - 51.0 % Final  . MCV 12/17/2013 93  79 - 97 fL Final  . MCH 12/17/2013 32.8  26.6 - 33.0 pg Final  . MCHC 12/17/2013 35.1  31.5 - 35.7 g/dL Final  . RDW 12/17/2013 14.0  12.3 - 15.4 % Final  . Neutrophils Relative % 12/17/2013 48   Final  . Lymphs 12/17/2013 37   Final  . Monocytes 12/17/2013 12   Final  . Eos 12/17/2013 2   Final  . Basos 12/17/2013 1   Final  . Neutrophils Absolute 12/17/2013 1.7  1.4 - 7.0 x10E3/uL Final  . Lymphocytes Absolute 12/17/2013 1.3  0.7 - 3.1 x10E3/uL Final  . Monocytes Absolute 12/17/2013 0.4  0.1 - 0.9 x10E3/uL Final  . Eosinophils Absolute 12/17/2013 0.1  0.0 - 0.4 x10E3/uL Final  . Basophils Absolute 12/17/2013 0.0  0.0 - 0.2 x10E3/uL Final  . Immature Granulocytes 12/17/2013 0   Final  . Immature Grans (Abs) 12/17/2013 0.0  0.0 - 0.1 x10E3/uL Final  . PSA 12/17/2013 3.9  0.0 - 4.0 ng/mL Final   Comment: Roche ECLIA methodology.                          According to the American  Urological Association, Serum PSA should                          decrease and remain at undetectable levels after radical                          prostatectomy. The AUA defines biochemical recurrence as an initial                          PSA value 0.2 ng/mL or greater followed by a subsequent confirmatory                          PSA value 0.2 ng/mL or greater.                          Values obtained with different assay methods or kits cannot be used                          interchangeably. Results cannot be interpreted as absolute evidence                          of the presence or absence of malignant disease.   12/23/12 EKG: rate 66. NSR. Incomplete RBBB. ST and T abnormalities suggestive of LV strain.  Assessment/Plan 1. Acute bronchitis  Doxycycline 100 bid x 10 d  2. Hyperlipidemia controlled - Lipid panel; Future  3. Dysphagia observe  4. GERD (gastroesophageal reflux disease) Continue Tums for now. Try Zantac or Pepcid if symptoms become more frequent  5. Obstructive sleep apnea (adult) (pediatric) Continue CPap  6. Screening PSA (prostate specific antigen) - PSA; Future  7. Encounter for long-term (current) use of other medications - CMP; Future

## 2013-12-23 NOTE — Patient Instructions (Signed)
Continue medications as listed 

## 2014-01-14 ENCOUNTER — Encounter: Payer: Self-pay | Admitting: Internal Medicine

## 2014-02-02 ENCOUNTER — Encounter: Payer: Self-pay | Admitting: Internal Medicine

## 2014-02-02 ENCOUNTER — Ambulatory Visit (INDEPENDENT_AMBULATORY_CARE_PROVIDER_SITE_OTHER): Payer: Commercial Managed Care - HMO | Admitting: Internal Medicine

## 2014-02-02 VITALS — BP 138/80 | HR 76 | Temp 98.3°F | Resp 18 | Wt 195.6 lb

## 2014-02-02 DIAGNOSIS — S8991XA Unspecified injury of right lower leg, initial encounter: Secondary | ICD-10-CM

## 2014-02-02 DIAGNOSIS — S8990XA Unspecified injury of unspecified lower leg, initial encounter: Secondary | ICD-10-CM

## 2014-02-02 DIAGNOSIS — S99929A Unspecified injury of unspecified foot, initial encounter: Secondary | ICD-10-CM

## 2014-02-02 DIAGNOSIS — S99919A Unspecified injury of unspecified ankle, initial encounter: Secondary | ICD-10-CM

## 2014-02-02 NOTE — Progress Notes (Signed)
Patient ID: Charles Salas, male   DOB: [birth year], 68 y.o.   MRN: [id number]    Chief Complaint  Patient presents with  . Referral    right knee problem   Allergies  Allergen Reactions  . Augmentin [Amoxicillin-Pot Clavulanate]    HPI 68 y/o male patient is seen for acute visit. He injured his right knee in April while playing tennis. He recently was playing tennis again and felt his right knee giving away and now has sharp pain on the posterior aspect of his right knee mainly with standing and movement. He has noted some swelling in right knee He has history of OA and undergone arthroscopic surgeries in his right knee  ROS Denies discoloration of his limb No pain in thigh or groin area Normal breathing No chest pain Has taken aleve once a day for last few days  Past Medical History  Diagnosis Date  . Dysphagia   . Sleep apnea   . Obstructive sleep apnea (adult) (pediatric)   . Special screening for malignant neoplasm of prostate   . Other symptoms involving respiratory system and chest   . Pain in joint, lower leg   . Corns and callosities   . Residual foreign body in soft tissue   . Headache(784.0)   . Hyperlipidemia   . GERD (gastroesophageal reflux disease)   . Diaphragmatic hernia without mention of obstruction or gangrene   . Fracture of scapula, right, closed 2013   Medication reviewed. See [facility]  Physical exam BP 138/80  Pulse 76  Temp(Src) 98.3 F (36.8 C)  Resp 18  Wt 195 lb 9.6 oz (88.724 kg)  SpO2 99%  Review of systems DATA OBTAINED: from patient GENERAL: Feels well   No fevers, fatigue, change in appetite or weight SKIN: No itch, rash or open wounds EYES: No eye pain, dryness or itching  No change in vision EARS: No earache, tinnitus, change in hearing NOSE: No congestion, drainage or bleeding MOUTH/THROAT: No mouth or tooth pain  No sore throat No difficulty chewing or swallowing RESPIRATORY: No cough, wheezing, SOB CARDIAC: No chest pain,  palpitations  No edema. CHEST/BREASTS: No discomfort, discharge or lumps in breasts GI: No abdominal pain  No N/V/D or constipation  No heartburn or reflux   GU: No dysuria, frequency or urgency  No change in urine volume or character No nocturia or change in stream   MUSCULOSKELETAL: No joint pain, swelling or stiffness  No back pain  No muscle ache, pain, weakness  Gait is steady  No recent falls. Bilateral Dupuytrens contracture. NEUROLOGIC: No dizziness, fainting, headache, imbalance, numbness  No change in mental status.   PSYCHIATRIC: No feelings of anxiety, depression Sleeps well.  No behavior issue.    Vital signs: BP 112/78  Pulse 63  Temp(Src) 98.2 F (36.8 C) (Oral)  Resp 16  Ht 6' (1.829 m)  Wt 191 lb (86.637 kg)  BMI 25.9 kg/m2  SpO2 99%  GENERAL APPEARANCE: No acute distress, normal body habitus. Alert, pleasant, conversant. SKIN: No diaphoresis rash, unusual lesions, wounds HEAD: Normocephalic, atraumatic RESPIRATORY: Breathing is even, unlabored. Lung sounds are clear and full.   CARDIOVASCULAR: Heart RRR.  MUSCULOSKELETAL: Moves all extremities. Right knee normal flexion and extension, able to rotate medically and laterally against resistance, has pain in posterolateral area- around lateral collateral ligament area. No effusion noted. Soft tissue swelling present on right side. PSYCHIATRIC: Mood and affect appropriate to situation  Assessment/plan  1. Right knee injury i have concern for  lateral collateral ligament injury given his clinical findings. Advised upon rest, using of brace, advil 600 mg tid for next 5 days to help subside inflammation and prn ice pack. Will provide referral to orthopedic given his hx of knee injuries and OA.  - Ambulatory referral to Orthopedic Surgery

## 2014-04-09 ENCOUNTER — Telehealth: Payer: Self-pay | Admitting: Pulmonary Disease

## 2014-04-09 DIAGNOSIS — G4733 Obstructive sleep apnea (adult) (pediatric): Secondary | ICD-10-CM

## 2014-04-09 NOTE — Telephone Encounter (Signed)
We received a fax from [place] asking for an order for a new mask fot the pt. The pt was last seen 10-2012, so I [id number] to get the pt set for a f/u appt and then I will send a message to [facility] to see if we can send an order for a new mask. Largo Bing, CMA

## 2014-04-09 NOTE — Telephone Encounter (Signed)
Returning a call to the nurse.

## 2014-04-09 NOTE — Telephone Encounter (Signed)
atc line busy wcb 

## 2014-04-12 NOTE — Telephone Encounter (Signed)
[place] with me to send order for new mask .

## 2014-04-12 NOTE — Telephone Encounter (Signed)
Appt set for 04-19-14 for follow-up. Dr. Gwenette Greet please advise if ok to send order for a new mask before pt appt on 8-31. Thanks. [place] Bing, CMA

## 2014-04-12 NOTE — Telephone Encounter (Signed)
Order has been placed for pt to get a new mask prior to her appt with [place] on 8/31.

## 2014-04-19 ENCOUNTER — Encounter (INDEPENDENT_AMBULATORY_CARE_PROVIDER_SITE_OTHER): Payer: Self-pay

## 2014-04-19 ENCOUNTER — Encounter: Payer: Self-pay | Admitting: Pulmonary Disease

## 2014-04-19 ENCOUNTER — Ambulatory Visit (INDEPENDENT_AMBULATORY_CARE_PROVIDER_SITE_OTHER): Payer: Commercial Managed Care - HMO | Admitting: Pulmonary Disease

## 2014-04-19 VITALS — BP 120/70 | HR 61 | Temp 97.0°F | Ht 72.0 in | Wt 190.2 lb

## 2014-04-19 DIAGNOSIS — G4733 Obstructive sleep apnea (adult) (pediatric): Secondary | ICD-10-CM

## 2014-04-19 NOTE — Patient Instructions (Signed)
Continue with cpap, and will send prescription to [place] for your supplies. Keep working on modest weight loss followup with me again in one year.

## 2014-04-19 NOTE — Progress Notes (Signed)
   Subjective:    Patient ID: Charles Salas, male    DOB: 1946/08/13, 68 y.o.   MRN: [id number]  HPI The patient comes in today for followup of his obstructive sleep apnea. He is wearing CPAP compliantly, and is having no issues with his device. He is in need of a new mask and supplies. He feels that he has slept well with the device, and has acceptable daytime alertness.  He has lost 13 pounds since the last visit.   Review of Systems  Constitutional: Negative for fever and unexpected weight change.  HENT: Negative for congestion, dental problem, ear pain, nosebleeds, postnasal drip, rhinorrhea, sinus pressure, sneezing, sore throat and trouble swallowing.   Eyes: Negative for redness and itching.  Respiratory: Negative for cough, chest tightness, shortness of breath and wheezing.   Cardiovascular: Negative for palpitations and leg swelling.  Gastrointestinal: Negative for nausea and vomiting.  Genitourinary: Negative for dysuria.  Musculoskeletal: Negative for joint swelling.  Skin: Negative for rash.  Neurological: Negative for headaches.  Hematological: Does not bruise/bleed easily.  Psychiatric/Behavioral: Negative for dysphoric mood. The patient is not nervous/anxious.        Objective:   Physical Exam Well-developed male in no acute distress Nose without purulence or discharge noted Neck without lymphadenopathy or thyromegaly No skin breakdown or pressure necrosis from the CPAP mask Lower extremities without edema, no cyanosis Alert, does not appear to be sleepy, moves all 4 extremities.       Assessment & Plan:

## 2014-04-19 NOTE — Assessment & Plan Note (Signed)
The patient has done extremely well with CPAP, and feels that his sleep is adequate as well as his daytime alertness. He has lost 13 pounds since last visit, and I've encouraged him to continue with modest weight loss. He also needs to keep up with his mask changes and supplies.

## 2014-09-08 ENCOUNTER — Ambulatory Visit (INDEPENDENT_AMBULATORY_CARE_PROVIDER_SITE_OTHER): Payer: PPO | Admitting: Internal Medicine

## 2014-09-08 ENCOUNTER — Encounter: Payer: Self-pay | Admitting: Internal Medicine

## 2014-09-08 VITALS — BP 140/88 | HR 74 | Temp 98.4°F | Ht 72.0 in | Wt 196.0 lb

## 2014-09-08 DIAGNOSIS — J301 Allergic rhinitis due to pollen: Secondary | ICD-10-CM | POA: Diagnosis not present

## 2014-09-08 DIAGNOSIS — G4733 Obstructive sleep apnea (adult) (pediatric): Secondary | ICD-10-CM | POA: Diagnosis not present

## 2014-09-08 DIAGNOSIS — H65199 Other acute nonsuppurative otitis media, unspecified ear: Secondary | ICD-10-CM | POA: Diagnosis not present

## 2014-09-08 MED ORDER — CEFUROXIME AXETIL 250 MG PO TABS: 250.0000 mg | ORAL_TABLET | Freq: Two times a day (BID) | ORAL | Status: DC

## 2014-09-08 NOTE — Progress Notes (Signed)
Patient ID: JAXXEN VOONG, male   DOB: 1945/09/23, 69 y.o.   MRN: [id number]    Facility  [facility]    Place of Service:   OFFICE   Allergies  Allergen Reactions  . Augmentin [Amoxicillin-Pot Clavulanate]     Chief Complaint  Patient presents with  . Acute Visit    Flu-like symptoms (101 fever, head congestion, headache's and scratchy throat)    HPI:  69 yo male  Seen today for above. He has 4 day hx of  Hx OSA on CPAP. (+) fever with Tmax 101F, post nasal drip, scratchy throat, fatigue, ear pressure/pain b/l, tinnitus b/l, reduced hearing worse than ususal, sinus pressure, nasal congestion, rhinorrhea, cough with dark green sputum pdtn, HA and sleep disturbance. Denies CP, SOB, chills, change in appetite. No sick contacts. Tried OTC advil cold and sinus and uses netti pot. No antihistamine tried. He did take a plane to [place], [place] and then to [place], [place] back to [place] to football games.   Medications: Patient's Medications  New Prescriptions   No medications on file  Previous Medications   MULTIPLE VITAMINS-MINERALS (MULTIVITAMIN WITH MINERALS) TABLET    Take 1 tablet by mouth daily.   OMEGA-3 FATTY ACIDS (FISH OIL PO)    Take 1,800 mg by mouth daily.  Modified Medications   No medications on file  Discontinued Medications   MELOXICAM (MOBIC) 15 MG TABLET    Take 1 tablet by mouth daily.     Review of Systems  As above. All other systems reviewed are negative.  Filed Vitals:   09/08/14 0813  BP: 140/88  Pulse: 74  Temp: 98.4 F (36.9 C)  TempSrc: Oral  Height: 6' (1.829 m)  Weight: 196 lb (88.905 kg)  SpO2: 97%   Body mass index is 26.58 kg/(m^2).  Physical Exam  CONSTITUTIONAL: Looks ill in NAD. Awake, alert and oriented x 3 HEENT: TMs red b/l nonbulging but retracted. Intact TMs. PERRLA. No sinus TTP but boggy tissue texture changes. Nares patent with grey turbinates. Oropharynx cobblestoning and red but no exudate NECK: Supple. Nontender. No palpable cervical or  supraclavicular lymph nodes. CVS: Regular rate without murmur, gallop or rub. LUNGS: CTA b/l no wheezing, rales or rhonchi. EXTREMITIES: No edema b/l.  Labs reviewed: No visits with results within 3 Month(s) from this visit. Latest known visit with results is:    Assessment/Plan    ICD-9-CM ICD-10-CM   1. Acute nonsuppurative otitis media, unspecified laterality 381.00 H65.199   2. Allergic rhinitis due to pollen 477.0 J30.1   3. OSA (obstructive sleep apnea) 327.23 G47.33     --Rx ceftin 250 mg BID x 10 days  --use netti pot prn to keep nose moist  --recommend OTC plain antihistamine daily  --recommend chew gum or suck on hard candy during take off and landing when taking airplanes  --continue prn advil cold/sinus prn fever or pain  --RTO if sx's do not improve or worsen  --f/u with Dr Nyoka Cowden as scheduled   Cordella Register. Perlie Gold  [facility] [address] [place], [place] [address] [phone] Office (Wednesdays and Fridays 8 AM - 5 PM) [phone] Cell (Monday-Friday 8 AM - 5 PM)

## 2014-09-08 NOTE — Patient Instructions (Addendum)
Drink plenty of fluids and rest  Recommend OTC plain claritin, zyrtec or allegra daily  Continue as needed advil for pain and fever  Use netti pot as needed to keep nose moist  Return to office if feeling no better or if symptoms worse  F/u with Dr Nyoka Cowden as scheduled

## 2014-12-27 ENCOUNTER — Other Ambulatory Visit: Payer: PPO

## 2014-12-27 DIAGNOSIS — E785 Hyperlipidemia, unspecified: Secondary | ICD-10-CM

## 2014-12-27 DIAGNOSIS — Z125 Encounter for screening for malignant neoplasm of prostate: Secondary | ICD-10-CM

## 2014-12-28 LAB — COMPREHENSIVE METABOLIC PANEL
A/G RATIO: 1.9 (ref 1.1–2.5)
ALT: 58 IU/L — ABNORMAL HIGH (ref 0–44)
AST: 156 IU/L — ABNORMAL HIGH (ref 0–40)
Albumin: 4.3 g/dL (ref 3.6–4.8)
Alkaline Phosphatase: 68 IU/L (ref 39–117)
BUN/Creatinine Ratio: 15 (ref 10–22)
BUN: 15 mg/dL (ref 8–27)
Bilirubin Total: 0.5 mg/dL (ref 0.0–1.2)
CALCIUM: 9.3 mg/dL (ref 8.6–10.2)
CO2: 23 mmol/L (ref 18–29)
CREATININE: 1 mg/dL (ref 0.76–1.27)
Chloride: 99 mmol/L (ref 97–108)
GFR calc Af Amer: 90 mL/min/{1.73_m2} (ref >59)
GFR, EST NON AFRICAN AMERICAN: 78 mL/min/{1.73_m2} (ref >59)
GLOBULIN, TOTAL: 2.3 g/dL (ref 1.5–4.5)
Glucose: 102 mg/dL — ABNORMAL HIGH (ref 65–99)
Potassium: 4.8 mmol/L (ref 3.5–5.2)
Sodium: 140 mmol/L (ref 134–144)
TOTAL PROTEIN: 6.6 g/dL (ref 6.0–8.5)

## 2014-12-28 LAB — LIPID PANEL
CHOLESTEROL TOTAL: 213 mg/dL — AB (ref 100–199)
Chol/HDL Ratio: 3.7 ratio units (ref 0.0–5.0)
HDL: 58 mg/dL (ref >39)
LDL Calculated: 123 mg/dL — ABNORMAL HIGH (ref 0–99)
Triglycerides: 159 mg/dL — ABNORMAL HIGH (ref 0–149)
VLDL Cholesterol Cal: 32 mg/dL (ref 5–40)

## 2014-12-28 LAB — PSA: PROSTATE SPECIFIC AG, SERUM: 4.8 ng/mL — AB (ref 0.0–4.0)

## 2014-12-29 ENCOUNTER — Encounter: Payer: Self-pay | Admitting: Internal Medicine

## 2014-12-29 ENCOUNTER — Ambulatory Visit (INDEPENDENT_AMBULATORY_CARE_PROVIDER_SITE_OTHER): Payer: PPO | Admitting: Internal Medicine

## 2014-12-29 VITALS — BP 118/76 | HR 74 | Temp 98.2°F | Resp 18 | Ht 72.0 in | Wt 191.2 lb

## 2014-12-29 DIAGNOSIS — R972 Elevated prostate specific antigen [PSA]: Secondary | ICD-10-CM

## 2014-12-29 DIAGNOSIS — K219 Gastro-esophageal reflux disease without esophagitis: Secondary | ICD-10-CM

## 2014-12-29 DIAGNOSIS — R1310 Dysphagia, unspecified: Secondary | ICD-10-CM

## 2014-12-29 DIAGNOSIS — R748 Abnormal levels of other serum enzymes: Secondary | ICD-10-CM | POA: Insufficient documentation

## 2014-12-29 DIAGNOSIS — G4733 Obstructive sleep apnea (adult) (pediatric): Secondary | ICD-10-CM

## 2014-12-29 DIAGNOSIS — E785 Hyperlipidemia, unspecified: Secondary | ICD-10-CM | POA: Diagnosis not present

## 2014-12-29 DIAGNOSIS — Z0000 Encounter for general adult medical examination without abnormal findings: Secondary | ICD-10-CM

## 2014-12-29 HISTORY — DX: Abnormal levels of other serum enzymes: R74.8

## 2014-12-29 HISTORY — DX: Elevated prostate specific antigen (PSA): R97.20

## 2014-12-29 NOTE — Addendum Note (Signed)
Addended by: Estill Dooms on: 12/29/2014 05:34 PM   Modules accepted: Level of Service

## 2014-12-29 NOTE — Progress Notes (Signed)
Passed clock test 

## 2014-12-29 NOTE — Progress Notes (Addendum)
Patient ID: Charles Salas, male   DOB: [birth year], 69 y.o.   MRN: [id number]    HISTORY AND PHYSICAL  Location:    [facility]   Place of Service:   OFFICE  Extended Emergency Contact Information Primary Emergency Contact: Hilyer,Pamela E Address: [address]          [place], [place] [address] [place] Phone: [phone] Mobile Phone: [phone] Relation: Spouse  Advanced Directive information  full code  Chief Complaint  Patient presents with  . Annual Exam    Annual exam, Discuss labs (copy printed)    HPI:  Annual exam. Patient generally doing well. Has lost 5 pounds since he was last here. Is engaged in regular tennis states and works out at [employer] about 3-4 days per week. He has no significant complaints today.  Hyperlipidemia - elevated LDL of 126  PSA elevation - new problem with PSA at 4.8. Denies any urologic symptoms  Elevated liver enzymes - new problem with SGOT and SGPT mildly elevated.  OSA (obstructive sleep apnea)-uses CPAP every night.  Gastroesophageal reflux disease without esophagitis-asymptomatic  Dysphagia-asymptomatic    Past Medical History  Diagnosis Date  . Dysphagia   . Sleep apnea   . Obstructive sleep apnea (adult) (pediatric)   . Special screening for malignant neoplasm of prostate   . Other symptoms involving respiratory system and chest   . Pain in joint, lower leg   . Corns and callosities   . Residual foreign body in soft tissue   . Headache(784.0)   . Hyperlipidemia   . GERD (gastroesophageal reflux disease)   . Diaphragmatic hernia without mention of obstruction or gangrene   . Fracture of scapula, right, closed 2013    Past Surgical History  Procedure Laterality Date  . 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 Caralyn Guile  . Left shoulder surgery Left 2003    shaved collar bone; Dr. Shellia Carwin  . Left knee arthroscopy Left 1985    Dr. Shellia Carwin  . Right knee arthroscopy Right 1009     Dr. Shellia Carwin  . Left knee arthroscopy Left 1995    Dr. Shellia Carwin    Patient Care Team: Estill Dooms, MD as PCP - General (Internal Medicine)  History   Social History  . Marital Status: Married    Spouse Name: N/A  . Number of Children: N/A  . Years of Education: N/A   Occupational History  . RETIRED MEDICAL SALES    Social History Main Topics  . Smoking status: Never Smoker   . Smokeless tobacco: Not on file  . Alcohol Use: Yes     Comment: 5-6 drinks per week  . Drug Use: No  . Sexual Activity: Not on file   Other Topics Concern  . Not on file   Social History Narrative     reports that he has never smoked. He does not have any smokeless tobacco history on file. He reports that he drinks alcohol. He reports that he does not use illicit drugs.  Family History  Problem Relation Age of Onset  . Prostate cancer Father   . Diabetes Father   . Diabetes Mother   . Stroke Mother    Family Status  Relation Status Death Age  . Father Alive   . Mother Deceased 82  . Sister Alive   . Sister Alive   . Sister Alive     Immunization History  Administered Date(s) Administered  .  Influenza Split 05/21/2011, 06/20/2012  . Influenza Whole 06/16/2013  . Influenza-Unspecified 06/05/2014  . Zoster 05/21/2011    Allergies  Allergen Reactions  . Augmentin [Amoxicillin-Pot Clavulanate] Diarrhea    Medications: Patient's Medications  New Prescriptions   No medications on file  Previous Medications   MULTIPLE VITAMINS-MINERALS (MULTIVITAMIN WITH MINERALS) TABLET    Take 1 tablet by mouth daily.   OMEGA-3 FATTY ACIDS (FISH OIL PO)    Take 1,800 mg by mouth daily.  Modified Medications   No medications on file  Discontinued Medications   CEFUROXIME (CEFTIN) 250 MG TABLET    Take 1 tablet (250 mg total) by mouth 2 (two) times daily with a meal.    Review of Systems  Constitutional: Negative for fever, chills, diaphoresis, activity change, appetite change,  fatigue and unexpected weight change.  HENT: Negative for congestion, ear pain, hearing loss, mouth sores, nosebleeds, postnasal drip, rhinorrhea, sinus pressure, sore throat and trouble swallowing.   Eyes: Positive for visual disturbance (CORRECTIVE LENSES).  Respiratory: Negative for cough, choking, chest tightness, shortness of breath, wheezing and stridor.   Cardiovascular: Negative for chest pain, palpitations and leg swelling.  Gastrointestinal: Negative for nausea, abdominal pain, diarrhea, constipation and abdominal distention.  Endocrine: Negative.   Genitourinary: Negative.   Musculoskeletal:       Bilateral Dupuytren's  Skin: Negative.   Allergic/Immunologic: Negative.   Neurological: Negative.   Hematological: Negative.   Psychiatric/Behavioral: Negative.     Filed Vitals:   12/29/14 1253  BP: 118/76  Pulse: 74  Temp: 98.2 F (36.8 C)  TempSrc: Oral  Resp: 18  Height: 6' (1.829 m)  Weight: 191 lb 3.2 oz (86.728 kg)  SpO2: 96%   Body mass index is 25.93 kg/(m^2).  Physical Exam  Constitutional: He is oriented to person, place, and time. He appears well-developed and well-nourished. No distress.  HENT:  Right Ear: External ear normal.  Left Ear: External ear normal.  Nose: Nose normal.  Mouth/Throat: Oropharynx is clear and moist. No oropharyngeal exudate.  Eyes: Conjunctivae and EOM are normal. Pupils are equal, round, and reactive to light.  Neck: No JVD present. No tracheal deviation present. No thyromegaly present.  Cardiovascular: Normal rate, regular rhythm, normal heart sounds and intact distal pulses.  Exam reveals no gallop and no friction rub.   No murmur heard. Pulmonary/Chest: No respiratory distress. He has no wheezes. He has no rales. He exhibits no tenderness.  Abdominal: He exhibits no distension and no mass. There is no tenderness.  Genitourinary: Rectum normal, prostate normal and penis normal. Guaiac negative stool.  Musculoskeletal: Normal  range of motion. He exhibits no edema or tenderness.  Lymphadenopathy:    He has no cervical adenopathy.  Neurological: He is alert and oriented to person, place, and time. He has normal reflexes. No cranial nerve deficit. Coordination normal.  Skin: No rash noted. No erythema. No pallor.  Psychiatric: He has a normal mood and affect. His behavior is normal. Judgment and thought content normal.     Labs reviewed: Lab on 12/27/2014  Component Date Value Ref Range Status  . Glucose 12/27/2014 102* 65 - 99 mg/dL Final  . BUN 12/27/2014 15  8 - 27 mg/dL Final  . Creatinine, Ser 12/27/2014 1.00  0.76 - 1.27 mg/dL Final  . GFR calc non Af Amer 12/27/2014 78  >59 mL/min/1.73 Final  . GFR calc Af Amer 12/27/2014 90  >59 mL/min/1.73 Final  . BUN/Creatinine Ratio 12/27/2014 15  10 - 22 Final  .  Sodium 12/27/2014 140  134 - 144 mmol/L Final  . Potassium 12/27/2014 4.8  3.5 - 5.2 mmol/L Final  . Chloride 12/27/2014 99  97 - 108 mmol/L Final  . CO2 12/27/2014 23  18 - 29 mmol/L Final  . Calcium 12/27/2014 9.3  8.6 - 10.2 mg/dL Final  . Total Protein 12/27/2014 6.6  6.0 - 8.5 g/dL Final  . Albumin 12/27/2014 4.3  3.6 - 4.8 g/dL Final  . Globulin, Total 12/27/2014 2.3  1.5 - 4.5 g/dL Final  . Albumin/Globulin Ratio 12/27/2014 1.9  1.1 - 2.5 Final  . Bilirubin Total 12/27/2014 0.5  0.0 - 1.2 mg/dL Final  . Alkaline Phosphatase 12/27/2014 68  39 - 117 IU/L Final  . AST 12/27/2014 156* 0 - 40 IU/L Final  . ALT 12/27/2014 58* 0 - 44 IU/L Final  . Cholesterol, Total 12/27/2014 213* 100 - 199 mg/dL Final  . Triglycerides 12/27/2014 159* 0 - 149 mg/dL Final  . HDL 12/27/2014 58  >39 mg/dL Final   Comment: According to ATP-III Guidelines, HDL-C >59 mg/dL is considered a negative risk factor for CHD.   Marland Kitchen VLDL Cholesterol Cal 12/27/2014 32  5 - 40 mg/dL Final  . LDL Calculated 12/27/2014 123* 0 - 99 mg/dL Final  . Chol/HDL Ratio 12/27/2014 3.7  0.0 - 5.0 ratio units Final   Comment:                                    T. Chol/HDL Ratio                                             Men  Women                               1/2 Avg.Risk  3.4    3.3                                   Avg.Risk  5.0    4.4                                2X Avg.Risk  9.6    7.1                                3X Avg.Risk 23.4   11.0   . Prostate Specific Ag, Serum 12/27/2014 4.8* 0.0 - 4.0 ng/mL Final   Comment: Roche ECLIA methodology. According to the American Urological Association, Serum PSA should decrease and remain at undetectable levels after radical prostatectomy. The AUA defines biochemical recurrence as an initial PSA value 0.2 ng/mL or greater followed by a subsequent confirmatory PSA value 0.2 ng/mL or greater. Values obtained with different assay methods or kits cannot be used interchangeably. Results cannot be interpreted as absolute evidence of the presence or absence of malignant disease.     Assessment/Plan  1. Hyperlipidemia Mild elevation in LDL. Continue to monitor. - Lipid panel; Future  2. PSA elevation Normal-size prostate without nodule. No symptoms in regards to urination. Discussed with patient. I advised  him that I felt it was safe to follow this and repeat PSA in about 4-6 months. Consider urologic referral if it is still high. - PSA; Future  3. Elevated liver enzymes Repeat in 4 months. Nothing at this time to suggest liver damage from hepatitis. Patient is a daily drinker of alcohol in modest amounts. He believes that he can cut this down and then simply recheck lab. - Comprehensive metabolic panel; Future  4. OSA (obstructive sleep apnea) Continue CPAP  5. Gastroesophageal reflux disease without esophagitis Asymptomatic  6. Dysphagia Asymptomatic    ----- Message from Cherlyn Roberts to Estill Dooms, MD sent at 01/02/2015 10:26 AM -----      Subject: Liver Enzymes    Have been doing a little looking into possible alternative causes of elevated enzymes and came  across this work concerning damaged muscle tissue from heavy workouts. 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 Thought this may be a contributor to the higher numbers. I am continuing on the reduced alcohol route with no changes in sleep patterns.        [email or web address]=[id number]        Good seeing you last week and thanks for your attention.        Pia Mau

## 2015-01-02 ENCOUNTER — Encounter: Payer: Self-pay | Admitting: Internal Medicine

## 2015-02-14 ENCOUNTER — Other Ambulatory Visit: Payer: Self-pay

## 2015-04-20 ENCOUNTER — Ambulatory Visit: Payer: Commercial Managed Care - HMO | Admitting: Pulmonary Disease

## 2015-04-26 ENCOUNTER — Ambulatory Visit (INDEPENDENT_AMBULATORY_CARE_PROVIDER_SITE_OTHER): Payer: PPO | Admitting: Pulmonary Disease

## 2015-04-26 ENCOUNTER — Encounter: Payer: Self-pay | Admitting: Pulmonary Disease

## 2015-04-26 VITALS — BP 138/92 | HR 64 | Temp 98.3°F | Ht 72.0 in | Wt 200.4 lb

## 2015-04-26 DIAGNOSIS — G4733 Obstructive sleep apnea (adult) (pediatric): Secondary | ICD-10-CM

## 2015-04-26 DIAGNOSIS — Z9989 Dependence on other enabling machines and devices: Principal | ICD-10-CM

## 2015-04-26 NOTE — Progress Notes (Signed)
Chief Complaint  Patient presents with  . Sleep Apnea    Former Charles Salas patient; pt forgot to bring SD card for download. Pt doing well on CPAP.  No flu shot    History of Present Illness: Charles Salas is a 69 y.o. male with OSA.  He was previously seen by Dr. Gwenette Greet.  He is doing well with CPAP.  No issues with mask fit.  He sleeps well, and feels rested.  TESTS: PSG 2013 >> AHI 34  PMhx >> HLD, GERD  Past surgical hx, Medications, Allergies, Family hx, Social hx all reviewed.   Physical Exam: BP 138/92 mmHg  Pulse 64  Temp(Src) 98.3 F (36.8 C) (Oral)  Ht 6' (1.829 m)  Wt 200 lb 6.4 oz (90.901 kg)  BMI 27.17 kg/m2  SpO2 97%  General - No distress ENT - No sinus tenderness, no oral exudate, no LAN, MP 2, scalloped tongue Cardiac - s1s2 regular, no murmur Chest - No wheeze/rales/dullness Back - No focal tenderness Abd - Soft, non-tender Ext - No edema Neuro - Normal strength Skin - No rashes Psych - normal mood, and behavior   Assessment/Plan:  Obstructive sleep apnea. He reports compliance with CPAP and benefit from therapy. Plan: - will call him with results of CPAP download - he will call if he decides to try oral appliance    Chesley Mires, MD [facility] Pager:  [phone]

## 2015-04-26 NOTE — Patient Instructions (Signed)
Follow up in 1 year.

## 2015-05-02 ENCOUNTER — Other Ambulatory Visit: Payer: PPO

## 2015-05-02 DIAGNOSIS — R748 Abnormal levels of other serum enzymes: Secondary | ICD-10-CM

## 2015-05-02 DIAGNOSIS — R972 Elevated prostate specific antigen [PSA]: Secondary | ICD-10-CM

## 2015-05-02 DIAGNOSIS — E785 Hyperlipidemia, unspecified: Secondary | ICD-10-CM

## 2015-05-03 LAB — COMPREHENSIVE METABOLIC PANEL
A/G RATIO: 2.1 (ref 1.1–2.5)
ALBUMIN: 4.5 g/dL (ref 3.6–4.8)
ALT: 16 IU/L (ref 0–44)
AST: 22 IU/L (ref 0–40)
Alkaline Phosphatase: 65 IU/L (ref 39–117)
BUN / CREAT RATIO: 14 (ref 10–22)
BUN: 13 mg/dL (ref 8–27)
Bilirubin Total: 0.4 mg/dL (ref 0.0–1.2)
CO2: 21 mmol/L (ref 18–29)
Calcium: 9.6 mg/dL (ref 8.6–10.2)
Chloride: 101 mmol/L (ref 97–108)
Creatinine, Ser: 0.9 mg/dL (ref 0.76–1.27)
GFR calc non Af Amer: 89 mL/min/{1.73_m2} (ref >59)
GFR, EST AFRICAN AMERICAN: 103 mL/min/{1.73_m2} (ref >59)
GLUCOSE: 96 mg/dL (ref 65–99)
Globulin, Total: 2.1 g/dL (ref 1.5–4.5)
POTASSIUM: 4.9 mmol/L (ref 3.5–5.2)
Sodium: 141 mmol/L (ref 134–144)
TOTAL PROTEIN: 6.6 g/dL (ref 6.0–8.5)

## 2015-05-03 LAB — PSA: PROSTATE SPECIFIC AG, SERUM: 4.5 ng/mL — AB (ref 0.0–4.0)

## 2015-05-03 LAB — LIPID PANEL
CHOL/HDL RATIO: 4.6 ratio (ref 0.0–5.0)
Cholesterol, Total: 216 mg/dL — ABNORMAL HIGH (ref 100–199)
HDL: 47 mg/dL (ref >39)
LDL Calculated: 128 mg/dL — ABNORMAL HIGH (ref 0–99)
Triglycerides: 205 mg/dL — ABNORMAL HIGH (ref 0–149)
VLDL Cholesterol Cal: 41 mg/dL — ABNORMAL HIGH (ref 5–40)

## 2015-05-04 ENCOUNTER — Encounter: Payer: Self-pay | Admitting: Internal Medicine

## 2015-05-04 ENCOUNTER — Ambulatory Visit (INDEPENDENT_AMBULATORY_CARE_PROVIDER_SITE_OTHER): Payer: PPO | Admitting: Internal Medicine

## 2015-05-04 VITALS — BP 138/88 | HR 64 | Temp 98.2°F | Resp 14 | Ht 72.0 in | Wt 198.2 lb

## 2015-05-04 DIAGNOSIS — G4733 Obstructive sleep apnea (adult) (pediatric): Secondary | ICD-10-CM | POA: Diagnosis not present

## 2015-05-04 DIAGNOSIS — R748 Abnormal levels of other serum enzymes: Secondary | ICD-10-CM | POA: Diagnosis not present

## 2015-05-04 DIAGNOSIS — R972 Elevated prostate specific antigen [PSA]: Secondary | ICD-10-CM

## 2015-05-04 DIAGNOSIS — E785 Hyperlipidemia, unspecified: Secondary | ICD-10-CM

## 2015-05-04 DIAGNOSIS — K219 Gastro-esophageal reflux disease without esophagitis: Secondary | ICD-10-CM | POA: Diagnosis not present

## 2015-05-04 DIAGNOSIS — Z23 Encounter for immunization: Secondary | ICD-10-CM

## 2015-05-04 MED ORDER — TETANUS-DIPHTH-ACELL PERTUSSIS 5-2.5-18.5 LF-MCG/0.5 IM SUSP: 0.5000 mL | Freq: Once | INTRAMUSCULAR | Status: DC

## 2015-05-04 NOTE — Patient Instructions (Signed)
Reminder: Please bring copy of [place] and Living Will

## 2015-05-04 NOTE — Addendum Note (Signed)
Addended by: Logan Bores on: 05/04/2015 04:32 PM   Modules accepted: Orders

## 2015-05-04 NOTE — Progress Notes (Signed)
Patient ID: Charles Salas, male   DOB: 1946-02-18, 69 y.o.   MRN: [id number]    Facility  [facility]    Place of Service:   OFFICE    Allergies  Allergen Reactions  . Augmentin [Amoxicillin-Pot Clavulanate] Diarrhea    Chief Complaint  Patient presents with  . Medical Management of Chronic Issues    4 month follow-up, discuss labs (copy printed)   . Immunizations    Discuss if patient can get flu and pneumonia vaccine today     HPI:  Elevated liver enzymes - resolved. Previous elevations may have been related to a very strenuous exercise prior to his 1 drop earlier this year.  Gastroesophageal reflux disease without esophagitis - comes and goes. Is back on omeprazole at this time.  Hyperlipidemia - adequately controlled  OSA (obstructive sleep apnea) - has seen Dr. Halford Chessman, pulmonologist. Using CPAP.  PSA elevation - slight improvement in previous value. Has moved from 4.8 down to 4.5. No urologic symptoms. Denies dysuria, increased frequency, hesitation, dribbling post void, or nocturia.    Medications: Patient's Medications  New Prescriptions   No medications on file  Previous Medications   MULTIPLE VITAMINS-MINERALS (MULTIVITAMIN WITH MINERALS) TABLET    Take 1 tablet by mouth daily.   OMEGA-3 FATTY ACIDS (FISH OIL PO)    Take 1,800 mg by mouth daily.   OMEPRAZOLE (PRILOSEC OTC) 20 MG TABLET    Take 20 mg by mouth daily.  Modified Medications   Modified Medication Previous Medication   TDAP (BOOSTRIX) 5-2.5-18.5 LF-MCG/0.5 INJECTION Tdap (BOOSTRIX) 5-2.5-18.5 LF-MCG/0.5 injection      Inject 0.5 mLs into the muscle once.    Inject 0.5 mLs into the muscle once.  Discontinued Medications   No medications on file     Review of Systems  Constitutional: Negative for fever, chills, diaphoresis, activity change, appetite change, fatigue and unexpected weight change.  HENT: Negative for congestion, ear pain, hearing loss, mouth sores, nosebleeds, postnasal drip, rhinorrhea, sinus  pressure, sore throat and trouble swallowing.   Eyes: Positive for visual disturbance (CORRECTIVE LENSES).  Respiratory: Negative for cough, choking, chest tightness, shortness of breath, wheezing and stridor.   Cardiovascular: Negative for chest pain, palpitations and leg swelling.  Gastrointestinal: Negative for nausea, abdominal pain, diarrhea, constipation and abdominal distention.  Endocrine: Negative.   Genitourinary: Negative.   Musculoskeletal:       Bilateral Dupuytren's  Skin: Negative.   Allergic/Immunologic: Negative.   Neurological: Negative.   Hematological: Negative.   Psychiatric/Behavioral: Negative.     Filed Vitals:   05/04/15 1457 05/04/15 1603  BP: 138/92 138/88  Pulse: 64   Temp: 98.2 F (36.8 C)   TempSrc: Oral   Resp: 14   Height: 6' (1.829 m)   Weight: 198 lb 3.2 oz (89.903 kg)   SpO2: 95%    Body mass index is 26.87 kg/(m^2).  Physical Exam  Constitutional: He is oriented to person, place, and time. He appears well-developed and well-nourished. No distress.  HENT:  Right Ear: External ear normal.  Left Ear: External ear normal.  Nose: Nose normal.  Mouth/Throat: Oropharynx is clear and moist. No oropharyngeal exudate.  Eyes: Conjunctivae and EOM are normal. Pupils are equal, round, and reactive to light.  Neck: No JVD present. No tracheal deviation present. No thyromegaly present.  Cardiovascular: Normal rate, regular rhythm, normal heart sounds and intact distal pulses.  Exam reveals no gallop and no friction rub.   No murmur heard. Pulmonary/Chest: No respiratory distress. He  has no wheezes. He has no rales. He exhibits no tenderness.  Abdominal: He exhibits no distension and no mass. There is no tenderness.  Genitourinary: Rectum normal, prostate normal and penis normal. Guaiac negative stool.  Musculoskeletal: Normal range of motion. He exhibits no edema or tenderness.  Lymphadenopathy:    He has no cervical adenopathy.  Neurological: He is  alert and oriented to person, place, and time. He has normal reflexes. No cranial nerve deficit. Coordination normal.  Skin: No rash noted. No erythema. No pallor.  Psychiatric: He has a normal mood and affect. His behavior is normal. Judgment and thought content normal.     Labs reviewed: Lab Summary Latest Ref Rng 05/02/2015 12/27/2014 12/17/2013  Hemoglobin 12.6 - 17.7 g/dL (None) (None) 15.2  Hematocrit 37.5 - 51.0 % (None) (None) 43.3  White count 3.4 - 10.8 x10E3/uL (None) (None) 3.6  Platelet count - (None) (None) (None)  Sodium 134 - 144 mmol/L 141 140 142  Potassium 3.5 - 5.2 mmol/L 4.9 4.8 5.2  Calcium 8.6 - 10.2 mg/dL 9.6 9.3 9.5  Phosphorus - (None) (None) (None)  Creatinine 0.76 - 1.27 mg/dL 0.90 1.00 1.05  AST 0 - 40 IU/L 22 156(H) 25  Alk Phos 39 - 117 IU/L 65 68 65  Bilirubin 0.0 - 1.2 mg/dL 0.4 0.5 0.5  Glucose 65 - 99 mg/dL 96 102(H) 88  Cholesterol - (None) (None) (None)  HDL cholesterol >39 mg/dL 47 58 56  Triglycerides 0 - 149 mg/dL 205(H) 159(H) 103  LDL Direct - (None) (None) (None)  LDL Calc 0 - 99 mg/dL 128(H) 123(H) 126(H)  Total protein - (None) (None) (None)  Albumin 3.6 - 4.8 g/dL 4.5 4.3 4.5   No results found for: TSH, T3TOTAL, T4TOTAL, THYROIDAB Lab Results  Component Value Date   BUN 13 05/02/2015     Lab Results  Component Value Date   PSA 4.5* 05/02/2015   PSA 4.8* 12/27/2014   PSA 3.9 12/17/2013      Assessment/Plan  1. Elevated liver enzymes Resolved - Comprehensive metabolic panel; Future  2. Gastroesophageal reflux disease without esophagitis Consider using ranitidine instead of omeprazole  3. Hyperlipidemia Adequately controlled - Lipid panel; Future  4. OSA (obstructive sleep apnea) Continue CPAP  5. PSA elevation - PSA; Future

## 2015-05-08 ENCOUNTER — Telehealth: Payer: Self-pay | Admitting: Pulmonary Disease

## 2015-05-08 NOTE — Telephone Encounter (Signed)
CPAP 01/26/15 to 04/25/15 >> used on 86 of 90 nights with average 7 hrs and 30 min.  Average AHI is 1.4 with CPAP 10 cm H2O.   Will have my nurse inform pt that CPAP report shows good control of sleep apnea.  No change to current set up needed.

## 2015-05-18 NOTE — Telephone Encounter (Signed)
Patient notified.  No questions or concerns at this time. Nothing further needed.   

## 2015-11-17 DIAGNOSIS — G4733 Obstructive sleep apnea (adult) (pediatric): Secondary | ICD-10-CM | POA: Diagnosis not present

## 2015-12-30 ENCOUNTER — Other Ambulatory Visit: Payer: PPO

## 2015-12-30 DIAGNOSIS — R748 Abnormal levels of other serum enzymes: Secondary | ICD-10-CM

## 2015-12-30 DIAGNOSIS — R972 Elevated prostate specific antigen [PSA]: Secondary | ICD-10-CM

## 2015-12-30 DIAGNOSIS — E785 Hyperlipidemia, unspecified: Secondary | ICD-10-CM | POA: Diagnosis not present

## 2015-12-31 LAB — LIPID PANEL
Chol/HDL Ratio: 3.7 ratio units (ref 0.0–5.0)
Cholesterol, Total: 198 mg/dL (ref 100–199)
HDL: 53 mg/dL (ref >39)
LDL CALC: 123 mg/dL — AB (ref 0–99)
Triglycerides: 109 mg/dL (ref 0–149)
VLDL CHOLESTEROL CAL: 22 mg/dL (ref 5–40)

## 2015-12-31 LAB — COMPREHENSIVE METABOLIC PANEL
ALK PHOS: 64 IU/L (ref 39–117)
ALT: 13 IU/L (ref 0–44)
AST: 20 IU/L (ref 0–40)
Albumin/Globulin Ratio: 2.2 (ref 1.2–2.2)
Albumin: 4.3 g/dL (ref 3.6–4.8)
BUN/Creatinine Ratio: 18 (ref 10–24)
BUN: 19 mg/dL (ref 8–27)
Bilirubin Total: 0.5 mg/dL (ref 0.0–1.2)
CO2: 24 mmol/L (ref 18–29)
CREATININE: 1.05 mg/dL (ref 0.76–1.27)
Calcium: 9.2 mg/dL (ref 8.6–10.2)
Chloride: 100 mmol/L (ref 96–106)
GFR calc Af Amer: 84 mL/min/{1.73_m2} (ref >59)
GFR calc non Af Amer: 73 mL/min/{1.73_m2} (ref >59)
GLUCOSE: 93 mg/dL (ref 65–99)
Globulin, Total: 2 g/dL (ref 1.5–4.5)
Potassium: 4.7 mmol/L (ref 3.5–5.2)
SODIUM: 140 mmol/L (ref 134–144)
Total Protein: 6.3 g/dL (ref 6.0–8.5)

## 2015-12-31 LAB — PSA: PROSTATE SPECIFIC AG, SERUM: 4.5 ng/mL — AB (ref 0.0–4.0)

## 2016-01-03 ENCOUNTER — Ambulatory Visit (INDEPENDENT_AMBULATORY_CARE_PROVIDER_SITE_OTHER): Payer: PPO | Admitting: Internal Medicine

## 2016-01-03 ENCOUNTER — Encounter: Payer: Self-pay | Admitting: Internal Medicine

## 2016-01-03 VITALS — BP 138/86 | HR 66 | Temp 98.1°F | Ht 72.0 in | Wt 199.0 lb

## 2016-01-03 DIAGNOSIS — E785 Hyperlipidemia, unspecified: Secondary | ICD-10-CM | POA: Diagnosis not present

## 2016-01-03 DIAGNOSIS — R972 Elevated prostate specific antigen [PSA]: Secondary | ICD-10-CM

## 2016-01-03 DIAGNOSIS — G4733 Obstructive sleep apnea (adult) (pediatric): Secondary | ICD-10-CM | POA: Diagnosis not present

## 2016-01-03 DIAGNOSIS — R1310 Dysphagia, unspecified: Secondary | ICD-10-CM | POA: Diagnosis not present

## 2016-01-03 DIAGNOSIS — Z0000 Encounter for general adult medical examination without abnormal findings: Secondary | ICD-10-CM

## 2016-01-03 DIAGNOSIS — K219 Gastro-esophageal reflux disease without esophagitis: Secondary | ICD-10-CM

## 2016-01-03 DIAGNOSIS — Z9989 Dependence on other enabling machines and devices: Secondary | ICD-10-CM

## 2016-01-03 NOTE — Progress Notes (Signed)
Patient ID: Charles Salas, male   DOB: Oct 19, 1945, 70 y.o.   MRN: [id number]    HISTORY AND PHYSICAL  Location:    [facility]   Place of Service:   OFFICE  Extended Emergency Contact Information Primary Emergency Contact: Salas,Charles E Address: [address]          [place], [place] [address] [place] Phone: [phone] Mobile Phone: [phone] Relation: Spouse  Advanced Directive information Does patient have an advance directive?: Yes, Type of Advance Directive: Healthcare Power of Charles Salas;Living will, Does patient want to make changes to advanced directive?: No - Patient declined  Chief Complaint  Patient presents with  . Annual Exam    Wellness exam  . Medical Management of Chronic Issues    medication management cholesterol, PSA elevation, elevated liver enzymes.  Marland Kitchen MMSE    30/30 passed clock drawing    HPI:   Dysphagia - asymptomatic  Hyperlipidemia - controlled  PSA elevation - stable  Gastroesophageal reflux disease without esophagitis - asymptomatic. Not using anything regularly.  OSA on CPAP - using regularly.    Past Medical History  Diagnosis Date  . Dysphagia   . Sleep apnea   . Obstructive sleep apnea (adult) (pediatric)   . Special screening for malignant neoplasm of prostate   . Other symptoms involving respiratory system and chest   . Pain in joint, lower leg   . Corns and callosities   . Residual foreign body in soft tissue   . Headache(784.0)   . Hyperlipidemia   . GERD (gastroesophageal reflux disease)   . Diaphragmatic hernia without mention of obstruction or gangrene   . Fracture of scapula, right, closed 2013  . Elevated liver enzymes 12/29/2014  . PSA elevation 12/29/2014    Past Surgical History  Procedure Laterality Date  . 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 Caralyn Salas  . Left shoulder surgery Left 2003    shaved collar bone; Dr. Shellia Salas  . Left knee arthroscopy Left 1985    Dr.  Shellia Salas  . Right knee arthroscopy Right 1009    Dr. Shellia Salas  . Left knee arthroscopy Left 1995    Dr. Shellia Salas    Patient Care Team: Charles Dooms, MD as PCP - General (Internal Medicine) Charles Hough, MD as Consulting Physician (Ophthalmology) Charles Maudlin, MD as Consulting Physician (Orthopedic Surgery)  Social History   Social History  . Marital Status: Married    Spouse Name: N/A  . Number of Children: N/A  . Years of Education: N/A   Occupational History  . RETIRED MEDICAL SALES    Social History Main Topics  . Smoking status: Never Smoker   . Smokeless tobacco: Never Used  . Alcohol Use: Yes     Comment: 5-6 drinks per week wine or liquor  . Drug Use: No  . Sexual Activity: Not on file   Other Topics Concern  . Not on file   Social History Narrative   Married - Charles Salas   Never smoke   Alcohol 5-6 wine or liquor per week   Exercise 2 times a week with trainer, play tennis 3 days per week    POA, Living Will       reports that he has never smoked. He has never used smokeless tobacco. He reports that he drinks alcohol. He reports that he does not use illicit drugs.  Family History  Problem Relation Age of Onset  . Prostate cancer Father   .  Diabetes Father   . Diabetes Mother   . Stroke Mother    Family Status  Relation Status Death Age  . Father Alive   . Mother Deceased 34  . Sister Alive   . Sister Alive   . Sister Alive   . Daughter Other   . Son Other     Immunization History  Administered Date(s) Administered  . DTaP 01/19/2015  . Influenza Split 05/21/2011, 06/20/2012  . Influenza Whole 06/16/2013  . Influenza,inj,Quad PF,36+ Mos 05/04/2015  . Influenza-Unspecified 06/05/2014  . Pneumococcal Conjugate-13 05/04/2015  . Zoster 05/21/2011    Allergies  Allergen Reactions  . Augmentin [Amoxicillin-Pot Clavulanate] Diarrhea    Medications: Patient's Medications  New Prescriptions   No medications on file  Previous  Medications   MULTIPLE VITAMINS-MINERALS (MULTIVITAMIN WITH MINERALS) TABLET    Take 1 tablet by mouth daily.   OMEGA-3 FATTY ACIDS (FISH OIL PO)    Take 1,800 mg by mouth daily.   OMEPRAZOLE (PRILOSEC OTC) 20 MG TABLET    Take 20 mg by mouth daily. As needed   TDAP (BOOSTRIX) 5-2.5-18.5 LF-MCG/0.5 INJECTION    Inject 0.5 mLs into the muscle once.  Modified Medications   No medications on file  Discontinued Medications   No medications on file    Review of Systems  Constitutional: Negative for fever, chills, diaphoresis, activity change, appetite change, fatigue and unexpected weight change.  HENT: Negative for congestion, ear pain, hearing loss, mouth sores, nosebleeds, postnasal drip, rhinorrhea, sinus pressure, sore throat and trouble swallowing.   Eyes: Positive for visual disturbance (CORRECTIVE LENSES).  Respiratory: Negative for cough, choking, chest tightness, shortness of breath, wheezing and stridor.   Cardiovascular: Negative for chest pain, palpitations and leg swelling.  Gastrointestinal: Negative for nausea, abdominal pain, diarrhea, constipation and abdominal distention.  Endocrine: Negative.   Genitourinary: Negative.   Musculoskeletal:       Bilateral Dupuytren's  Skin: Negative.   Allergic/Immunologic: Negative.   Neurological: Negative.   Hematological: Negative.   Psychiatric/Behavioral: Negative.     Filed Vitals:   01/03/16 1529  BP: 138/86  Pulse: 66  Temp: 98.1 F (36.7 C)  TempSrc: Oral  Height: 6' (1.829 m)  Weight: 199 lb (90.266 kg)  SpO2: 97%   Body mass index is 26.98 kg/(m^2). Filed Weights   01/03/16 1529  Weight: 199 lb (90.266 kg)     Physical Exam  Constitutional: He is oriented to person, place, and time. He appears well-developed and well-nourished. No distress.  HENT:  Right Ear: External ear normal.  Left Ear: External ear normal.  Nose: Nose normal.  Mouth/Throat: Oropharynx is clear and moist. No oropharyngeal exudate.    Eyes: Conjunctivae and EOM are normal. Pupils are equal, round, and reactive to light.  Neck: No JVD present. No tracheal deviation present. No thyromegaly present.  Cardiovascular: Normal rate, regular rhythm, normal heart sounds and intact distal pulses.  Exam reveals no gallop and no friction rub.   No murmur heard. Pulmonary/Chest: No respiratory distress. He has no wheezes. He has no rales. He exhibits no tenderness.  Abdominal: He exhibits no distension and no mass. There is no tenderness.  Genitourinary: Rectum normal, prostate normal and penis normal. Guaiac negative stool.  Musculoskeletal: Normal range of motion. He exhibits no edema or tenderness.  Lymphadenopathy:    He has no cervical adenopathy.  Neurological: He is alert and oriented to person, place, and time. He has normal reflexes. No cranial nerve deficit. Coordination normal.  01/03/16  MMSE 30/30. Passed clock drawing.  Skin: No rash noted. No erythema. No pallor.  Psychiatric: He has a normal mood and affect. His behavior is normal. Judgment and thought content normal.    Labs reviewed: Lab Summary Latest Ref Rng 12/30/2015 05/02/2015 12/27/2014  Hemoglobin 13.0-17.0 g/dL (None) (None) (None)  Hematocrit 39.0-52.0 % (None) (None) (None)  White count - (None) (None) (None)  Platelet count - (None) (None) (None)  Sodium 134 - 144 mmol/L 140 141 140  Potassium 3.5 - 5.2 mmol/L 4.7 4.9 4.8  Calcium 8.6 - 10.2 mg/dL 9.2 9.6 9.3  Phosphorus - (None) (None) (None)  Creatinine 0.76 - 1.27 mg/dL 1.05 0.90 1.00  AST 0 - 40 IU/L 20 22 156(H)  Alk Phos 39 - 117 IU/L 64 65 68  Bilirubin 0.0 - 1.2 mg/dL 0.5 0.4 0.5  Glucose 65 - 99 mg/dL 93 96 102(H)  Cholesterol - (None) (None) (None)  HDL cholesterol >39 mg/dL 53 47 58  Triglycerides 0 - 149 mg/dL 109 205(H) 159(H)  LDL Direct - (None) (None) (None)  LDL Calc 0 - 99 mg/dL 123(H) 128(H) 123(H)  Total protein - (None) (None) (None)  Albumin 3.6 - 4.8 g/dL 4.3 4.5 4.3   Lab  Results  Component Value Date   BUN 19 12/30/2015     Assessment/Plan  1. Dysphagia improved  2. Hyperlipidemia controlled  3. PSA elevation stable  4. Gastroesophageal reflux disease without esophagitis imroved  5. OSA on CPAP Continues CPAP

## 2016-02-17 DIAGNOSIS — G4733 Obstructive sleep apnea (adult) (pediatric): Secondary | ICD-10-CM | POA: Diagnosis not present

## 2016-03-06 DIAGNOSIS — M1711 Unilateral primary osteoarthritis, right knee: Secondary | ICD-10-CM | POA: Diagnosis not present

## 2016-03-13 DIAGNOSIS — M1711 Unilateral primary osteoarthritis, right knee: Secondary | ICD-10-CM | POA: Diagnosis not present

## 2016-03-13 DIAGNOSIS — M659 Synovitis and tenosynovitis, unspecified: Secondary | ICD-10-CM | POA: Diagnosis not present

## 2016-03-19 DIAGNOSIS — M1711 Unilateral primary osteoarthritis, right knee: Secondary | ICD-10-CM | POA: Diagnosis not present

## 2016-04-23 ENCOUNTER — Encounter: Payer: Self-pay | Admitting: Adult Health

## 2016-04-25 ENCOUNTER — Encounter: Payer: Self-pay | Admitting: Adult Health

## 2016-04-25 ENCOUNTER — Ambulatory Visit (INDEPENDENT_AMBULATORY_CARE_PROVIDER_SITE_OTHER): Payer: PPO | Admitting: Adult Health

## 2016-04-25 DIAGNOSIS — G4733 Obstructive sleep apnea (adult) (pediatric): Secondary | ICD-10-CM

## 2016-04-25 DIAGNOSIS — Z9989 Dependence on other enabling machines and devices: Principal | ICD-10-CM

## 2016-04-25 NOTE — Progress Notes (Signed)
Reviewed and agree with assessment/plan.  Chesley Mires, MD [facility] Pulmonary/Critical Care 04/25/2016, 10:35 AM Pager:  [phone]

## 2016-04-25 NOTE — Assessment & Plan Note (Signed)
Doing very well on CPAP -well controlled  Plan  Patient Instructions  Continue on C Pap at bedtime. Keep up the good work. Do not drive if sleepy Follow-up in one year with Dr. Halford Chessman and As needed

## 2016-04-25 NOTE — Progress Notes (Signed)
Subjective:    Patient ID: Charles Salas, male    DOB: 07/30/1946, 70 y.o.   MRN: [id number]  HPI 70 yo male followed for severe OSA .   TEST  PSG 2013 >> AHI 34  04/25/2016 Follow up : OSA  Pt returns for 1 year follow up for OSA . He says he is doing well on CPAP At bedtime   Feels rested without sigificant daytime sleepiness.  Download shows excellent compliance with average usage at 7.5 hours. He is on a set pressure of  10 cm H2O. AHI 1.0. Denies chest pain, orthopnea or edema.    Past Medical History:  Diagnosis Date  . Corns and callosities   . Diaphragmatic hernia without mention of obstruction or gangrene   . Dysphagia   . Elevated liver enzymes 12/29/2014  . Fracture of scapula, right, closed 2013  . GERD (gastroesophageal reflux disease)   . Headache(784.0)   . Hyperlipidemia   . Obstructive sleep apnea (adult) (pediatric)   . Other symptoms involving respiratory system and chest   . Pain in joint, lower leg   . PSA elevation 12/29/2014  . Residual foreign body in soft tissue   . Sleep apnea   . Special screening for malignant neoplasm of prostate       Review of Systems Constitutional:   No  weight loss, night sweats,  Fevers, chills,  +fatigue, or  lassitude.  HEENT:   No headaches,  Difficulty swallowing,  Tooth/dental problems, or  Sore throat,                No sneezing, itching, ear ache, nasal congestion, post nasal drip,   CV:  No chest pain,  Orthopnea, PND, swelling in lower extremities, anasarca, dizziness, palpitations, syncope.   GI  No heartburn, indigestion, abdominal pain, nausea, vomiting, diarrhea, change in bowel habits, loss of appetite, bloody stools.   Resp: No shortness of breath with exertion or at rest.  No excess mucus, no productive cough,  No non-productive cough,  No coughing up of blood.  No change in color of mucus.  No wheezing.  No chest wall deformity  Skin: no rash or lesions.  GU: no dysuria, change in color of urine,  no urgency or frequency.  No flank pain, no hematuria   MS:  No joint pain or swelling.  No decreased range of motion.  No back pain.  Psych:  No change in mood or affect. No depression or anxiety.  No memory loss.         Objective:   Physical Exam Vitals:   04/25/16 0915  BP: (!) 150/86  Pulse: 67  Temp: 98.1 F (36.7 C)  TempSrc: Oral  SpO2: 96%  Weight: 205 lb (93 kg)  Height: 6' (1.829 m)     GEN: A/Ox3; pleasant , NAD, well nourished    HEENT:  [place]/AT,  EACs-clear, TMs-wnl, NOSE-clear, THROAT-clear, no lesions, no postnasal drip or exudate noted. Class 2-3 MP airway   NECK:  Supple w/ fair ROM; no JVD; normal carotid impulses w/o bruits; no thyromegaly or nodules palpated; no lymphadenopathy.    RESP  Clear  P & A; w/o, wheezes/ rales/ or rhonchi. no accessory muscle use, no dullness to percussion  CARD:  RRR, no m/r/g  , no peripheral edema, pulses intact, no cyanosis or clubbing.  GI:   Soft & nt; nml bowel sounds; no organomegaly or masses detected.   Musco: Warm bil, no deformities or joint swelling  noted.   Neuro: alert, no focal deficits noted.    Skin: Warm, no lesions or rashes  Zyonna Vardaman NP-C  [facility]  04/25/2016     Assessment & Plan:

## 2016-04-25 NOTE — Patient Instructions (Signed)
Continue on C Pap at bedtime. Keep up the good work. Do not drive if sleepy Follow-up in one year with Dr. Halford Chessman and As needed

## 2016-05-21 DIAGNOSIS — G4733 Obstructive sleep apnea (adult) (pediatric): Secondary | ICD-10-CM | POA: Diagnosis not present

## 2016-05-22 ENCOUNTER — Telehealth: Payer: Self-pay | Admitting: Pulmonary Disease

## 2016-05-22 DIAGNOSIS — Z9989 Dependence on other enabling machines and devices: Principal | ICD-10-CM

## 2016-05-22 DIAGNOSIS — G4733 Obstructive sleep apnea (adult) (pediatric): Secondary | ICD-10-CM

## 2016-05-22 NOTE — Telephone Encounter (Signed)
Spoke with the pt  He states that he is unhappy with customer service at [facility] and wants to switch to [facility] for CPAP  I have sent order to [facility]  Nothing further needed per pt

## 2016-05-28 ENCOUNTER — Telehealth: Payer: Self-pay | Admitting: Pulmonary Disease

## 2016-05-28 NOTE — Telephone Encounter (Signed)
I can not find a sleep study  I spoke with Corene Cornea, and he states they are needing it b/c pt is switching from [facility]  I called pt to see if he remembers where/when PSG was done  [facility]

## 2016-05-29 NOTE — Telephone Encounter (Signed)
lmtcb x2 for pt. 

## 2016-05-29 NOTE — Telephone Encounter (Signed)
[phone] pt calling back

## 2016-05-29 NOTE — Telephone Encounter (Signed)
Attempted to contact pt. No answer, no option to leave a message. Will try back.  

## 2016-05-30 NOTE — Telephone Encounter (Signed)
Attempted to contact pt. No answer, no option to leave a message. Will try back.  

## 2016-05-31 NOTE — Telephone Encounter (Signed)
Spoke with patient, states that he had a HST about 5 years by Dr Jeanmarie Hubert at [facility] ([place]) Will need to call and get results from this MD -- pt states that he will look in his home files and see if he has a copy and we will go from there. Patient to call back.

## 2016-05-31 NOTE — Telephone Encounter (Signed)
LMTC x `1 for pt 

## 2016-06-05 ENCOUNTER — Telehealth: Payer: Self-pay | Admitting: Pulmonary Disease

## 2016-06-05 NOTE — Telephone Encounter (Signed)
FYI to VS.

## 2016-06-13 ENCOUNTER — Encounter: Payer: Self-pay | Admitting: Adult Health

## 2016-06-26 ENCOUNTER — Ambulatory Visit (INDEPENDENT_AMBULATORY_CARE_PROVIDER_SITE_OTHER): Payer: PPO

## 2016-06-26 ENCOUNTER — Ambulatory Visit (INDEPENDENT_AMBULATORY_CARE_PROVIDER_SITE_OTHER): Payer: PPO | Admitting: Internal Medicine

## 2016-06-26 ENCOUNTER — Encounter: Payer: Self-pay | Admitting: Internal Medicine

## 2016-06-26 VITALS — BP 138/80 | HR 68 | Temp 98.5°F | Ht 72.0 in | Wt 208.6 lb

## 2016-06-26 DIAGNOSIS — Z23 Encounter for immunization: Secondary | ICD-10-CM | POA: Diagnosis not present

## 2016-06-26 DIAGNOSIS — L84 Corns and callosities: Secondary | ICD-10-CM | POA: Insufficient documentation

## 2016-06-26 DIAGNOSIS — Z0000 Encounter for general adult medical examination without abnormal findings: Secondary | ICD-10-CM | POA: Diagnosis not present

## 2016-06-26 NOTE — Patient Instructions (Addendum)
Preventive Care for Adults  A healthy lifestyle and preventive care can promote health and wellness. Preventive health guidelines for adults include the following key practices.  . A routine yearly physical is a good way to check with your health care provider about your health and preventive screening. It is a chance to share any concerns and updates on your health and to receive a thorough exam.  . Visit your dentist for a routine exam and preventive care every 6 months. Brush your teeth twice a day and floss once a day. Good oral hygiene prevents tooth decay and gum disease.  . The frequency of eye exams is based on your age, health, family medical history, use  of contact lenses, and other factors. Follow your health care provider's ecommendations for frequency of eye exams.  . Eat a healthy diet. Foods like vegetables, fruits, whole grains, low-fat dairy products, and lean protein foods contain the nutrients you need without too many calories. Decrease your intake of foods high in solid fats, added sugars, and salt. Eat the right amount of calories for you. Get information about a proper diet from your health care provider, if necessary.  . Regular physical exercise is one of the most important things you can do for your health. Most adults should get at least 150 minutes of moderate-intensity exercise (any activity that increases your heart rate and causes you to sweat) each week. In addition, most adults need muscle-strengthening exercises on 2 or more days a week.  Silver Sneakers may be a benefit available to you. To determine eligibility, you may visit the website: www.silversneakers.com or contact program at 1-866-584-7389 Mon-Fri between 8AM-8PM.   . Maintain a healthy weight. The body mass index (BMI) is a screening tool to identify possible weight problems. It provides an estimate of body fat based on height and weight. Your health care provider can find your BMI and can help you  achieve or maintain a healthy weight.   For adults 20 years and older: ? A BMI below 18.5 is considered underweight. ? A BMI of 18.5 to 24.9 is normal. ? A BMI of 25 to 29.9 is considered overweight. ? A BMI of 30 and above is considered obese.   . Maintain normal blood lipids and cholesterol levels by exercising and minimizing your intake of saturated fat. Eat a balanced diet with plenty of fruit and vegetables. Blood tests for lipids and cholesterol should begin at age 20 and be repeated every 5 years. If your lipid or cholesterol levels are high, you are over 50, or you are at high risk for heart disease, you may need your cholesterol levels checked more frequently. Ongoing high lipid and cholesterol levels should be treated with medicines if diet and exercise are not working.  . If you smoke, find out from your health care provider how to quit. If you do not use tobacco, please do not start.  . If you choose to drink alcohol, please do not consume more than 2 drinks per day. One drink is considered to be 12 ounces (355 mL) of beer, 5 ounces (148 mL) of wine, or 1.5 ounces (44 mL) of liquor.  . If you are 55-79 years old, ask your health care provider if you should take aspirin to prevent strokes.  . Use sunscreen. Apply sunscreen liberally and repeatedly throughout the day. You should seek shade when your shadow is shorter than you. Protect yourself by wearing long sleeves, pants, a wide-brimmed hat, and sunglasses year   round, whenever you are outdoors.  . Once a month, do a whole body skin exam, using a mirror to look at the skin on your back. Tell your health care provider of new moles, moles that have irregular borders, moles that are larger than a pencil eraser, or moles that have changed in shape or color.   Charles Salas , Thank you for taking time to come for your Medicare Wellness Visit. I appreciate your ongoing commitment to your health goals. Please review the following plan we  discussed and let me know if I can assist you in the future.   These are the goals we discussed: Goals    . Exercise 3x per week (30 min per time)          Starting 06/26/16, I will maintain my current exercise routine.        This is a list of the screening recommended for you and due dates:  Health Maintenance  Topic Date Due  .  Hepatitis C: One time screening is recommended by Center for Disease Control  (CDC) for  adults born from [birth year] through 1963.   1946-06-27  . Colon Cancer Screening  08/16/2017*  . Tetanus Vaccine  08/16/2017*  . Flu Shot  Completed  . Shingles Vaccine  Completed  . Pneumonia vaccines  Completed  *Topic was postponed. The date shown is not the original due date.

## 2016-06-26 NOTE — Progress Notes (Signed)
Subjective:   Charles Salas is a 70 y.o. male who presents for an Initial Medicare Annual Wellness Visit.  Review of Systems  Cardiac Risk Factors include: advanced age (>14men, >50 women);male gender    Objective:    Today's Vitals   06/26/16 1107 06/26/16 1111  BP: 138/80   Pulse: 68   Temp: 98.5 F (36.9 C)   TempSrc: Oral   SpO2: 99%   Weight: 208 lb 9.6 oz (94.6 kg)   Height: 6' (1.829 m)   PainSc: 2  2    Body mass index is 28.29 kg/m.  Current Medications (verified) Outpatient Encounter Prescriptions as of 06/26/2016  Medication Sig  . Multiple Vitamins-Minerals (MULTIVITAMIN WITH MINERALS) tablet Take 1 tablet by mouth daily.  . Omega-3 Fatty Acids (FISH OIL PO) Take 1,800 mg by mouth daily.  . [DISCONTINUED] omeprazole (PRILOSEC OTC) 20 MG tablet Take 20 mg by mouth daily. As needed   No facility-administered encounter medications on file as of 06/26/2016.     Allergies (verified) Augmentin [amoxicillin-pot clavulanate]   History: Past Medical History:  Diagnosis Date  . Corns and callosities   . Diaphragmatic hernia without mention of obstruction or gangrene   . Dysphagia   . Elevated liver enzymes 12/29/2014  . Fracture of scapula, right, closed 2013  . GERD (gastroesophageal reflux disease)   . Headache(784.0)   . Hyperlipidemia   . Obstructive sleep apnea (adult) (pediatric)   . Other symptoms involving respiratory system and chest   . Pain in joint, lower leg   . PSA elevation 12/29/2014  . Residual foreign body in soft tissue   . Sleep apnea   . Special screening for malignant neoplasm of prostate    Past Surgical History:  Procedure Laterality Date  . Left knee arthroscopy Left 1985   Dr. Shellia Carwin  . left knee arthroscopy Left 1995   Dr. Shellia Carwin  . left shoulder surgery Left 2003   shaved collar bone; Dr. Shellia Carwin  . Right knee arthroscopy Right 1009   Dr. Shellia Carwin  . 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  Caralyn Guile   Family History  Problem Relation Age of Onset  . Prostate cancer Father   . Diabetes Father   . Diabetes Mother   . Stroke Mother    Social History   Occupational History  . RETIRED MEDICAL SALES    Social History Main Topics  . Smoking status: Never Smoker  . Smokeless tobacco: Never Used  . Alcohol use Yes     Comment: 5-6 drinks per week wine or liquor  . Drug use: No  . Sexual activity: Yes   Tobacco Counseling Counseling given: No   Activities of Daily Living In your present state of health, do you have any difficulty performing the following activities: 06/26/2016  Hearing? Y  Vision? N  Difficulty concentrating or making decisions? N  Walking or climbing stairs? N  Dressing or bathing? N  Doing errands, shopping? N  Preparing Food and eating ? N  Using the Toilet? N  In the past six months, have you accidently leaked urine? N  Do you have problems with loss of bowel control? N  Managing your Medications? N  Managing your Finances? N  Housekeeping or managing your Housekeeping? N  Some recent data might be hidden    Immunizations and Health Maintenance Immunization History  Administered Date(s) Administered  . DTaP 01/19/2015  . Influenza Split 05/21/2011, 06/20/2012  . Influenza Whole  06/16/2013  . Influenza,inj,Quad PF,36+ Mos 05/04/2015, 06/26/2016  . Influenza-Unspecified 06/05/2014  . Pneumococcal Conjugate-13 05/04/2015  . Pneumococcal Polysaccharide-23 06/26/2016  . Zoster 05/21/2011   Health Maintenance Due  Topic Date Due  . Hepatitis C Screening  Mar 24, 1946    Patient Care Team: Estill Dooms, MD as PCP - General (Internal Medicine) Shon Hough, MD as Consulting Physician (Ophthalmology) Latanya Maudlin, MD as Consulting Physician (Orthopedic Surgery)  Indicate any recent Medical Services you may have received from other than [facility] providers in the past year (date may be approximate).    Assessment:   This is a routine  wellness examination for Lang.   Hearing/Vision screen Hearing Screening Comments: Last hearing screen done several years ago. Due for screening.  Vision Screening Comments: Last eye exam done last year, pt told he did not have to come back for 2 years.   Dietary issues and exercise activities discussed: Current Exercise Habits: Structured exercise class, Type of exercise: stretching, Time (Minutes): 30, Frequency (Times/Week): 3, Weekly Exercise (Minutes/Week): 90, Intensity: Moderate, Exercise limited by: orthopedic condition(s)  Goals    . Exercise 3x per week (30 min per time)          Starting 06/26/16, I will maintain my current exercise routine.       Depression Screen PHQ 2/9 Scores 06/26/2016 01/03/2016 09/08/2014 12/23/2013  PHQ - 2 Score 0 0 0 0    Fall Risk Fall Risk  06/26/2016 01/03/2016 05/04/2015 12/29/2014 09/08/2014  Falls in the past year? No No No No No    Cognitive Function: MMSE - Mini Mental State Exam 06/26/2016 01/03/2016 12/29/2014  Orientation to time 5 5 5   Orientation to Place 5 5 5   Registration 3 3 3   Attention/ Calculation 5 5 5   Recall 3 3 2   Language- name 2 objects 2 2 2   Language- repeat 1 1 1   Language- follow 3 step command 3 3 3   Language- read & follow direction 1 1 1   Write a sentence 1 1 1   Copy design 1 1 1   Total score 30 30 29         Screening Tests Health Maintenance  Topic Date Due  . Hepatitis C Screening  1945/11/23  . COLONOSCOPY  08/16/2017 (Originally 06/23/1998)  . TETANUS/TDAP  08/16/2017 (Originally 06/24/1967)  . INFLUENZA VACCINE  Completed  . ZOSTAVAX  Completed  . PNA vac Low Risk Adult  Completed        Plan:    I have personally reviewed and addressed the Medicare Annual Wellness questionnaire and have noted the following in the patient's chart:  A. Medical and social history B. Use of alcohol, tobacco or illicit drugs  C. Current medications and supplements D. Functional ability and status E.  Nutritional  status F.  Physical activity G. Advance directives H. List of other physicians I.  Hospitalizations, surgeries, and ER visits in previous 12 months J.  [place] to include hearing, vision, cognitive, depression L. Referrals and appointments - none  In addition, I have reviewed and discussed with patient certain preventive protocols, quality metrics, and best practice recommendations. A written personalized care plan for preventive services as well as general preventive health recommendations were provided to patient.  See attached scanned questionnaire for additional information.   Signed,   Allyn Kenner, LPN Health Advisor  I reviewed health advisors note, was available for consultation and agree with documentation and plan.  Carlos American. [place], [facility] [phone] 8  am - 5 pm) [phone] (after hours)

## 2016-06-26 NOTE — Progress Notes (Signed)
Facility  [place]    Place of Service:   OFFICE    Allergies  Allergen Reactions  . Augmentin [Amoxicillin-Pot Clavulanate] Diarrhea    Chief Complaint  Patient presents with  . Acute Visit    corns on both feet outer part. Hurts to walk without shoes.     HPI:  Tender corn of the right 5th MT on the sole of the foot. Tender callus of the left foot laterally, but there is no visible corn.  Medications: Patient's Medications  New Prescriptions   No medications on file  Previous Medications   MULTIPLE VITAMINS-MINERALS (MULTIVITAMIN WITH MINERALS) TABLET    Take 1 tablet by mouth daily.   OMEGA-3 FATTY ACIDS (FISH OIL PO)    Take 1,800 mg by mouth daily.   RANITIDINE (ZANTAC) 150 MG TABLET    Take 150 mg by mouth. Take one tablet daily for stomach  Modified Medications   No medications on file  Discontinued Medications   No medications on file    Review of Systems  Constitutional: Negative for activity change, appetite change, chills, diaphoresis, fatigue, fever and unexpected weight change.  HENT: Negative for congestion, ear pain, hearing loss, mouth sores, nosebleeds, postnasal drip, rhinorrhea, sinus pressure, sore throat and trouble swallowing.   Eyes: Positive for visual disturbance (CORRECTIVE LENSES).  Respiratory: Negative for cough, choking, chest tightness, shortness of breath, wheezing and stridor.   Cardiovascular: Negative for chest pain, palpitations and leg swelling.  Gastrointestinal: Negative for abdominal distention, abdominal pain, constipation, diarrhea and nausea.  Endocrine: Negative.   Genitourinary: Negative.   Musculoskeletal:       Bilateral Dupuytren's  Skin: Negative.   Allergic/Immunologic: Negative.   Neurological: Negative.   Hematological: Negative.   Psychiatric/Behavioral: Negative.     Vitals:   06/26/16 1411  BP: (!) 158/98  Pulse: 72  Temp: 97.7 F (36.5 C)  TempSrc: Oral  SpO2: 97%  Weight: 210 lb (95.3 kg)   Body  mass index is 28.48 kg/m. Wt Readings from Last 3 Encounters:  06/26/16 210 lb (95.3 kg)  06/26/16 208 lb 9.6 oz (94.6 kg)  04/25/16 205 lb (93 kg)      Physical Exam  Constitutional: He is oriented to person, place, and time. He appears well-developed and well-nourished. No distress.  HENT:  Right Ear: External ear normal.  Left Ear: External ear normal.  Nose: Nose normal.  Mouth/Throat: Oropharynx is clear and moist. No oropharyngeal exudate.  Eyes: Conjunctivae and EOM are normal. Pupils are equal, round, and reactive to light.  Neck: No JVD present. No tracheal deviation present. No thyromegaly present.  Cardiovascular: Normal rate, regular rhythm, normal heart sounds and intact distal pulses.  Exam reveals no gallop and no friction rub.   No murmur heard. Pulmonary/Chest: No respiratory distress. He has no wheezes. He has no rales. He exhibits no tenderness.  Abdominal: He exhibits no distension and no mass. There is no tenderness.  Genitourinary: Rectum normal, prostate normal and penis normal. Rectal exam shows guaiac negative stool.  Musculoskeletal: Normal range of motion. He exhibits no edema or tenderness.  Lymphadenopathy:    He has no cervical adenopathy.  Neurological: He is alert and oriented to person, place, and time. He has normal reflexes. No cranial nerve deficit. Coordination normal.  01/03/16 MMSE 30/30. Passed clock drawing.  Skin: No rash noted. No erythema. No pallor.  Tender corn at the 5th MT head on sole of foot  Psychiatric: He has a normal mood  and affect. His behavior is normal. Judgment and thought content normal.    Labs reviewed: Lab Summary Latest Ref Rng & Units 12/30/2015 05/02/2015 12/27/2014  Hemoglobin 13.0-17.0 g/dL (None) (None) (None)  Hematocrit 39.0-52.0 % (None) (None) (None)  White count - (None) (None) (None)  Platelet count - (None) (None) (None)  Sodium 134 - 144 mmol/L 140 141 140  Potassium 3.5 - 5.2 mmol/L 4.7 4.9 4.8  Calcium  8.6 - 10.2 mg/dL 9.2 9.6 9.3  Phosphorus - (None) (None) (None)  Creatinine 0.76 - 1.27 mg/dL 1.05 0.90 1.00  AST 0 - 40 IU/L 20 22 156(H)  Alk Phos 39 - 117 IU/L 64 65 68  Bilirubin 0.0 - 1.2 mg/dL 0.5 0.4 0.5  Glucose 65 - 99 mg/dL 93 96 102(H)  Cholesterol - (None) (None) (None)  HDL cholesterol >39 mg/dL 53 47 58  Triglycerides 0 - 149 mg/dL 109 205(H) 159(H)  LDL Direct - (None) (None) (None)  LDL Calc 0 - 99 mg/dL 123(H) 128(H) 123(H)  Total protein - (None) (None) (None)  Albumin 3.6 - 4.8 g/dL 4.3 4.5 4.3  Some recent data might be hidden   No results found for: TSH, T3TOTAL, T4TOTAL, THYROIDAB Lab Results  Component Value Date   BUN 19 12/30/2015   BUN 13 05/02/2015   BUN 15 12/27/2014   No results found for: HGBA1C  Assessment/Plan  1. Corn Sharply debrided. Pain improved.

## 2016-09-25 ENCOUNTER — Encounter: Payer: Self-pay | Admitting: Internal Medicine

## 2016-10-01 DIAGNOSIS — H5213 Myopia, bilateral: Secondary | ICD-10-CM | POA: Diagnosis not present

## 2016-10-01 DIAGNOSIS — H524 Presbyopia: Secondary | ICD-10-CM | POA: Diagnosis not present

## 2016-10-01 DIAGNOSIS — H25813 Combined forms of age-related cataract, bilateral: Secondary | ICD-10-CM | POA: Diagnosis not present

## 2016-11-06 DIAGNOSIS — Z1211 Encounter for screening for malignant neoplasm of colon: Secondary | ICD-10-CM | POA: Diagnosis not present

## 2016-11-06 DIAGNOSIS — K219 Gastro-esophageal reflux disease without esophagitis: Secondary | ICD-10-CM | POA: Diagnosis not present

## 2016-11-06 DIAGNOSIS — G4733 Obstructive sleep apnea (adult) (pediatric): Secondary | ICD-10-CM | POA: Diagnosis not present

## 2016-11-06 DIAGNOSIS — Z86010 Personal history of colonic polyps: Secondary | ICD-10-CM | POA: Diagnosis not present

## 2016-11-12 DIAGNOSIS — K2210 Ulcer of esophagus without bleeding: Secondary | ICD-10-CM | POA: Diagnosis not present

## 2016-11-12 DIAGNOSIS — K210 Gastro-esophageal reflux disease with esophagitis: Secondary | ICD-10-CM | POA: Diagnosis not present

## 2016-11-12 DIAGNOSIS — Z86010 Personal history of colonic polyps: Secondary | ICD-10-CM | POA: Diagnosis not present

## 2016-11-12 DIAGNOSIS — R12 Heartburn: Secondary | ICD-10-CM | POA: Diagnosis not present

## 2016-11-12 LAB — HM COLONOSCOPY

## 2016-11-20 ENCOUNTER — Encounter: Payer: Self-pay | Admitting: Internal Medicine

## 2016-12-13 ENCOUNTER — Other Ambulatory Visit: Payer: Self-pay

## 2016-12-13 DIAGNOSIS — E785 Hyperlipidemia, unspecified: Secondary | ICD-10-CM

## 2016-12-13 DIAGNOSIS — R972 Elevated prostate specific antigen [PSA]: Secondary | ICD-10-CM

## 2016-12-31 ENCOUNTER — Other Ambulatory Visit: Payer: PPO

## 2017-01-02 ENCOUNTER — Encounter: Payer: PPO | Admitting: Internal Medicine

## 2017-01-21 ENCOUNTER — Other Ambulatory Visit: Payer: PPO

## 2017-01-21 DIAGNOSIS — R972 Elevated prostate specific antigen [PSA]: Secondary | ICD-10-CM

## 2017-01-21 DIAGNOSIS — E785 Hyperlipidemia, unspecified: Secondary | ICD-10-CM

## 2017-01-21 LAB — COMPLETE METABOLIC PANEL WITH GFR
ALT: 16 U/L (ref 9–46)
AST: 26 U/L (ref 10–35)
Albumin: 4.2 g/dL (ref 3.6–5.1)
Alkaline Phosphatase: 55 U/L (ref 40–115)
BUN: 18 mg/dL (ref 7–25)
CHLORIDE: 102 mmol/L (ref 98–110)
CO2: 24 mmol/L (ref 20–31)
CREATININE: 1.21 mg/dL (ref 0.70–1.25)
Calcium: 9.6 mg/dL (ref 8.6–10.3)
GFR, Est African American: 71 mL/min (ref >=60)
GFR, Est Non African American: 61 mL/min (ref >=60)
GLUCOSE: 85 mg/dL (ref 65–99)
Potassium: 4.8 mmol/L (ref 3.5–5.3)
Sodium: 137 mmol/L (ref 135–146)
Total Bilirubin: 0.7 mg/dL (ref 0.2–1.2)
Total Protein: 6.4 g/dL (ref 6.1–8.1)

## 2017-01-21 LAB — LIPID PANEL
CHOL/HDL RATIO: 4.1 ratio (ref <5.0)
Cholesterol: 198 mg/dL (ref <200)
HDL: 48 mg/dL (ref >40)
LDL Cholesterol: 129 mg/dL — ABNORMAL HIGH (ref <100)
Triglycerides: 104 mg/dL (ref <150)
VLDL: 21 mg/dL (ref <30)

## 2017-01-22 LAB — PSA: PSA: 4.3 ng/mL — ABNORMAL HIGH (ref <=4.0)

## 2017-01-23 ENCOUNTER — Encounter: Payer: Self-pay | Admitting: Internal Medicine

## 2017-01-23 ENCOUNTER — Ambulatory Visit (INDEPENDENT_AMBULATORY_CARE_PROVIDER_SITE_OTHER): Payer: PPO | Admitting: Internal Medicine

## 2017-01-23 VITALS — BP 138/82 | HR 59 | Temp 97.7°F | Ht 72.0 in | Wt 186.0 lb

## 2017-01-23 DIAGNOSIS — I4510 Unspecified right bundle-branch block: Secondary | ICD-10-CM | POA: Diagnosis not present

## 2017-01-23 DIAGNOSIS — G4733 Obstructive sleep apnea (adult) (pediatric): Secondary | ICD-10-CM

## 2017-01-23 DIAGNOSIS — R972 Elevated prostate specific antigen [PSA]: Secondary | ICD-10-CM

## 2017-01-23 DIAGNOSIS — Z9989 Dependence on other enabling machines and devices: Secondary | ICD-10-CM

## 2017-01-23 DIAGNOSIS — K219 Gastro-esophageal reflux disease without esophagitis: Secondary | ICD-10-CM | POA: Diagnosis not present

## 2017-01-23 DIAGNOSIS — E785 Hyperlipidemia, unspecified: Secondary | ICD-10-CM

## 2017-01-23 DIAGNOSIS — G839 Paralytic syndrome, unspecified: Secondary | ICD-10-CM | POA: Diagnosis not present

## 2017-01-23 NOTE — Addendum Note (Signed)
Addended by: Estill Dooms on: 01/23/2017 06:09 PM   Modules accepted: Level of Service

## 2017-01-23 NOTE — Progress Notes (Addendum)
HISTORY AND PHYSICAL  Location:    Office   Place of Service:     Extended Emergency Contact Information Primary Emergency Contact: Danowski,Pamela E Address: [address]          [place], [place] [address] [place] Phone: [phone] Mobile Phone: [phone] Relation: Spouse  Advanced Directive information Does Patient Have a Medical Advance Directive?: Yes, Type of Advance Directive: Healthcare Power of [place];Living will  Chief Complaint  Patient presents with  . Annual Exam    Physical exam   . Medical Management of Chronic Issues    medication management cholesterol, GERD, elevated liver enzyness, elevated PSA    HPI:  6 weeks ago he ad episode when reaching in the closet that his right hand would not work for a couple of minutes. Right arm worked fine. Felt a little light headed. No headache or palpitations or vision changes. His mother had a history of small strokes and carotid disease.  Hyperlipidemia, unspecified hyperlipidemia type - controlled  RBBB (right bundle branch block) -stable  Gastroesophageal reflux disease without esophagitis - much improved since he lost 10#.  PSA elevation - stable at 4.3  OSA on CPAP - using regularly     Past Medical History:  Diagnosis Date  . Corns and callosities   . Diaphragmatic hernia without mention of obstruction or gangrene   . Dysphagia   . Elevated liver enzymes 12/29/2014  . Fracture of scapula, right, closed 2013  . GERD (gastroesophageal reflux disease)   . Headache(784.0)   . Hyperlipidemia   . Obstructive sleep apnea (adult) (pediatric)   . Other symptoms involving respiratory system and chest   . Pain in joint, lower leg   . PSA elevation 12/29/2014  . RBBB (right bundle branch block)   . Residual foreign body in soft tissue   . Sleep apnea   . Special screening for malignant neoplasm of prostate     Past Surgical History:  Procedure Laterality Date  . Left knee  arthroscopy Left 1985   Dr. Shellia Carwin  . left knee arthroscopy Left 1995   Dr. Shellia Carwin  . left shoulder surgery Left 2003   shaved collar bone; Dr. Shellia Carwin  . Right knee arthroscopy Right 1009   Dr. Shellia Carwin  . 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 Caralyn Guile    Patient Care Team: Estill Dooms, MD as PCP - General (Internal Medicine) Shon Hough, MD as Consulting Physician (Ophthalmology) Latanya Maudlin, MD as Consulting Physician (Orthopedic Surgery)  Social History   Social History  . Marital status: Married    Spouse name: N/A  . Number of children: N/A  . Years of education: N/A   Occupational History  . RETIRED MEDICAL SALES    Social History Main Topics  . Smoking status: Never Smoker  . Smokeless tobacco: Never Used  . Alcohol use Yes     Comment: 5-6 drinks per week wine or liquor  . Drug use: No  . Sexual activity: Yes   Other Topics Concern  . Not on file   Social History Narrative   Married - Olin Hauser   Never smoke   Alcohol 5-6 wine or liquor per week   Exercise 3 times a week with trainer, gym 5 days per week   POA, Living Will       reports that he has never smoked. He has never used smokeless tobacco. He reports that he drinks alcohol. He reports that  he does not use drugs.  Family History  Problem Relation Age of Onset  . Prostate cancer Father   . Diabetes Father   . Diabetes Mother   . Stroke Mother    Family Status  Relation Status  . Father Alive  . Mother Deceased at age 53  . Sister Alive  . Sister Alive  . Sister Alive  . Daughter Other  . Son Other    Immunization History  Administered Date(s) Administered  . DTaP 01/19/2015  . Influenza Split 05/21/2011, 06/20/2012  . Influenza Whole 06/16/2013  . Influenza,inj,Quad PF,36+ Mos 05/04/2015, 06/26/2016  . Influenza-Unspecified 06/05/2014  . Pneumococcal Conjugate-13 05/04/2015  . Pneumococcal Polysaccharide-23 06/26/2016  . Zoster  05/21/2011    Allergies  Allergen Reactions  . Augmentin [Amoxicillin-Pot Clavulanate] Diarrhea    Medications: Patient's Medications  New Prescriptions   No medications on file  Previous Medications   MULTIPLE VITAMINS-MINERALS (MULTIVITAMIN WITH MINERALS) TABLET    Take 1 tablet by mouth daily.   OMEGA-3 FATTY ACIDS (FISH OIL PO)    Take 1,800 mg by mouth daily.   RANITIDINE (ZANTAC) 150 MG TABLET    Take 150 mg by mouth. Take one tablet twice daily for stomach  Modified Medications   No medications on file  Discontinued Medications   No medications on file    Review of Systems  Constitutional: Negative for activity change, appetite change, chills, diaphoresis, fatigue, fever and unexpected weight change.  HENT: Negative for congestion, ear pain, hearing loss, mouth sores, nosebleeds, postnasal drip, rhinorrhea, sinus pressure, sore throat and trouble swallowing.   Eyes: Positive for visual disturbance (CORRECTIVE LENSES).  Respiratory: Negative for cough, choking, chest tightness, shortness of breath, wheezing and stridor.        Uses CPAP  Cardiovascular: Negative for chest pain, palpitations and leg swelling.  Gastrointestinal: Negative for abdominal distention, abdominal pain, constipation, diarrhea and nausea.  Endocrine: Negative.   Genitourinary: Negative.   Musculoskeletal:       Bilateral Dupuytren's  Skin: Negative.   Allergic/Immunologic: Negative.   Neurological: Negative.   Hematological: Negative.   Psychiatric/Behavioral: Negative.     Vitals:   01/23/17 1446  BP: 138/82  Pulse: (!) 59  Temp: 97.7 F (36.5 C)  TempSrc: Oral  SpO2: 98%  Weight: 186 lb (84.4 kg)  Height: 6' (1.829 m)   Body mass index is 25.23 kg/m. Filed Weights   01/23/17 1446  Weight: 186 lb (84.4 kg)     Physical Exam  Constitutional: He is oriented to person, place, and time. He appears well-developed and well-nourished. No distress.  HENT:  Right Ear: External ear  normal.  Left Ear: External ear normal.  Nose: Nose normal.  Mouth/Throat: Oropharynx is clear and moist. No oropharyngeal exudate.  Eyes: Conjunctivae and EOM are normal. Pupils are equal, round, and reactive to light.  Neck: No JVD present. No tracheal deviation present. No thyromegaly present.  Cardiovascular: Normal rate, regular rhythm, normal heart sounds and intact distal pulses.  Exam reveals no gallop and no friction rub.   No murmur heard. Pulmonary/Chest: No respiratory distress. He has no wheezes. He has no rales. He exhibits no tenderness.  Abdominal: He exhibits no distension and no mass. There is no tenderness.  Genitourinary: Rectum normal, prostate normal and penis normal. Rectal exam shows guaiac negative stool.  Musculoskeletal: Normal range of motion. He exhibits no edema or tenderness.  Lymphadenopathy:    He has no cervical adenopathy.  Neurological: He is alert and  oriented to person, place, and time. He has normal reflexes. No cranial nerve deficit. Coordination normal.  01/03/16 MMSE 30/30. Passed clock drawing.  Skin: No rash noted. No erythema. No pallor.  Psychiatric: He has a normal mood and affect. His behavior is normal. Judgment and thought content normal.    Labs reviewed: Lab Summary Latest Ref Rng & Units 01/21/2017 12/30/2015 05/02/2015  Hemoglobin 13.0-17.0  (None) (None) (None)  Hematocrit 39.0-52.0  (None) (None) (None)  White count - (None) (None) (None)  Platelet count - (None) (None) (None)  Sodium 135 - 146 mmol/L 137 140 141  Potassium 3.5 - 5.3 mmol/L 4.8 4.7 4.9  Calcium 8.6 - 10.3 mg/dL 9.6 9.2 9.6  Phosphorus - (None) (None) (None)  Creatinine 0.70 - 1.25 mg/dL 1.21 1.05 0.90  AST 10 - 35 U/L '26 20 22  '$ Alk Phos 40 - 115 U/L 55 64 65  Bilirubin 0.2 - 1.2 mg/dL 0.7 0.5 0.4  Glucose 65 - 99 mg/dL 85 93 96  Cholesterol <200 mg/dL 198 (None) (None)  HDL cholesterol >40 mg/dL 48 53 47  Triglycerides <150 mg/dL 104 109 205(H)  LDL Direct -  (None) (None) (None)  LDL Calc <100 mg/dL 129(H) 123(H) 128(H)  Total protein 6.1 - 8.1 g/dL 6.4 (None) (None)  Albumin 3.6 - 5.1 g/dL 4.2 4.3 4.5  Some recent data might be hidden   Lab Results  Component Value Date   BUN 18 01/21/2017   01/23/17 EKG: rate 59. NSR Incomplete RBBB. No change from prior EKG.  Assessment/Plan  1. RBBB (right bundle branch block) unchanged - EKG 12-Lead  2. Hyperlipidemia, unspecified hyperlipidemia type controlled  3. Gastroesophageal reflux disease without esophagitis Controlled. Much better since he lost 10#.  4. PSA elevation Stable at 4.3  5. OSA on CPAP Using regularly  6. Paresis ([place]) Resolved. Etiology uncertain Ordered CT brain, carotid Doppler, and Echocardiogram

## 2017-02-04 DIAGNOSIS — M1711 Unilateral primary osteoarthritis, right knee: Secondary | ICD-10-CM | POA: Diagnosis not present

## 2017-02-04 DIAGNOSIS — M25561 Pain in right knee: Secondary | ICD-10-CM | POA: Diagnosis not present

## 2017-02-05 ENCOUNTER — Ambulatory Visit
Admission: RE | Admit: 2017-02-05 | Discharge: 2017-02-05 | Disposition: A | Discharge status: Home / Self Care | Payer: PPO | Source: Ambulatory Visit | Attending: Internal Medicine | Admitting: Internal Medicine

## 2017-02-05 DIAGNOSIS — G839 Paralytic syndrome, unspecified: Secondary | ICD-10-CM

## 2017-02-05 DIAGNOSIS — I6523 Occlusion and stenosis of bilateral carotid arteries: Secondary | ICD-10-CM | POA: Diagnosis not present

## 2017-02-06 ENCOUNTER — Telehealth: Payer: Self-pay | Admitting: *Deleted

## 2017-02-06 NOTE — Telephone Encounter (Signed)
Romie Minus with [facility] called and left message on voicemail and stated that Dr. Nyoka Cowden placed an order for patient to receive CT with and without Contrast. Per Radiologist patient only needs the CT without contrast.  Returned call and Romie Minus already spoke with Earlie Server and correction made.

## 2017-02-08 ENCOUNTER — Ambulatory Visit
Admission: RE | Admit: 2017-02-08 | Discharge: 2017-02-08 | Disposition: A | Discharge status: Home / Self Care | Payer: PPO | Source: Ambulatory Visit | Attending: Internal Medicine | Admitting: Internal Medicine

## 2017-02-08 DIAGNOSIS — G839 Paralytic syndrome, unspecified: Secondary | ICD-10-CM

## 2017-02-08 DIAGNOSIS — M6281 Muscle weakness (generalized): Secondary | ICD-10-CM | POA: Diagnosis not present

## 2017-02-13 ENCOUNTER — Telehealth: Payer: Self-pay

## 2017-02-13 NOTE — Telephone Encounter (Signed)
Patient medication list has been updated to reflect 81 mg aspirin added based on imaging results.

## 2017-02-28 ENCOUNTER — Other Ambulatory Visit: Payer: Self-pay

## 2017-02-28 ENCOUNTER — Ambulatory Visit (HOSPITAL_COMMUNITY): Payer: PPO | Attending: Cardiology

## 2017-02-28 DIAGNOSIS — G839 Paralytic syndrome, unspecified: Secondary | ICD-10-CM | POA: Insufficient documentation

## 2017-03-27 ENCOUNTER — Telehealth: Payer: Self-pay | Admitting: Pulmonary Disease

## 2017-03-27 DIAGNOSIS — G4733 Obstructive sleep apnea (adult) (pediatric): Secondary | ICD-10-CM

## 2017-03-27 NOTE — Telephone Encounter (Signed)
Pt requesting quieter machine.  This is for a new order.  I will route this to triage.

## 2017-03-27 NOTE — Telephone Encounter (Signed)
Pt is aware that we have placed order for new quieter CPAP machine through [facility] and patient will contact office to set up yearly follow p with VS.

## 2017-03-28 ENCOUNTER — Telehealth: Payer: Self-pay | Admitting: Pulmonary Disease

## 2017-03-28 DIAGNOSIS — G4733 Obstructive sleep apnea (adult) (pediatric): Secondary | ICD-10-CM

## 2017-03-28 NOTE — Telephone Encounter (Signed)
According to when the pt last saw TP and a download was obtain, in her OV notes it states pt is set at 10cm. Spoke with [facility] and they stated they need [place] to redo the order that was sent yesterday due to it not having pt's settings because they were not the original people to set patient up on machine. New order has been placed. Nothing additional is needed.

## 2017-04-09 DIAGNOSIS — G4733 Obstructive sleep apnea (adult) (pediatric): Secondary | ICD-10-CM | POA: Diagnosis not present

## 2017-04-19 ENCOUNTER — Telehealth: Payer: Self-pay | Admitting: Pulmonary Disease

## 2017-04-19 DIAGNOSIS — G4733 Obstructive sleep apnea (adult) (pediatric): Secondary | ICD-10-CM

## 2017-04-19 DIAGNOSIS — Z9989 Dependence on other enabling machines and devices: Principal | ICD-10-CM

## 2017-04-19 NOTE — Telephone Encounter (Signed)
Spoke with patient. He stated that he has received his new CPAP machine from [facility] and he is having a hard time with the pressure. It is currently set at 10cm and it feels like the mask will blow off of his face at times. He has to adjust the mask several times during the night due to the pressure. He wants to see if VS would lower the pressure for him.   I have printed out a DL and placed it on VS' desk.   VS, please advise. Thanks!

## 2017-04-23 NOTE — Telephone Encounter (Signed)
Okay to change CPAP to 8 cm H2O.

## 2017-04-23 NOTE — Telephone Encounter (Signed)
Order placed.  Pt aware.  Nothing further needed.  

## 2017-05-10 ENCOUNTER — Ambulatory Visit (INDEPENDENT_AMBULATORY_CARE_PROVIDER_SITE_OTHER): Payer: PPO | Admitting: Pulmonary Disease

## 2017-05-10 ENCOUNTER — Encounter: Payer: Self-pay | Admitting: Pulmonary Disease

## 2017-05-10 VITALS — BP 142/78 | HR 71 | Ht 72.0 in | Wt 188.0 lb

## 2017-05-10 DIAGNOSIS — R634 Abnormal weight loss: Secondary | ICD-10-CM

## 2017-05-10 DIAGNOSIS — G4733 Obstructive sleep apnea (adult) (pediatric): Secondary | ICD-10-CM | POA: Diagnosis not present

## 2017-05-10 NOTE — Patient Instructions (Signed)
Will arrange for home sleep study and call with results  Follow up in 1 year 

## 2017-05-10 NOTE — Progress Notes (Signed)
Current Outpatient Prescriptions on File Prior to Visit  Medication Sig  . Multiple Vitamins-Minerals (MULTIVITAMIN WITH MINERALS) tablet Take 1 tablet by mouth daily.  . Omega-3 Fatty Acids (FISH OIL PO) Take 1,800 mg by mouth daily.  . ranitidine (ZANTAC) 150 MG tablet Take 150 mg by mouth. Take one tablet twice daily for stomach  . aspirin EC 81 MG tablet Take 81 mg by mouth daily.   No current facility-administered medications on file prior to visit.      Chief Complaint  Patient presents with  . Follow-up    Pt left machine in [place] at parents home over a week, and seen no difference in sleeping at night. Pt lost over 20lbs since last visit and would like to discuss with Dr. Halford Chessman if he needs to con't with CPAP.     Sleep tests PSG 2013 >> AHI 34 CPAP 01/26/15 to 04/25/15 >> used on 86 of 90 nights with average 7 hrs and 30 min.  Average AHI is 1.4 with CPAP 10 cm H2O  Cardiac tests Echo 02/28/17 >> EF 55 to 60%, grade 1 DD  Past medical history GERD  Past surgical history, Family history, Social history, Allergies all reviewed.  Vital Signs BP (!) 142/78 (BP Location: Left Arm, Cuff Size: Normal)   Pulse 71   Ht 6' (1.829 m)   Wt 188 lb (85.3 kg)   SpO2 95%   BMI 25.50 kg/m   History of Present Illness Charles Salas is a 71 y.o. male with obstructive sleep apnea.  He did weight watchers.  He has lost about 20 lbs.  He is sleeping better.  Had his CPAP decreased to 8 cm and this helped.  No issue with mask fit.  Physical Exam  General - pleasant Eyes - pupils reactive ENT - no sinus tenderness, no oral exudate, no LAN Cardiac - regular, no murmur Chest - no wheeze, rales Abd - soft, non tender Ext - no edema Skin - no rashes Neuro - normal strength Psych - normal mood   Assessment/Plan  Obstructive sleep apnea. - he is compliant with CPAP and reports benefit from therapy - he has lost weight and is questioning whether he needs to continue using CPAP - will  arrange for home sleep study to assess current status of sleep apnea - if he needs to continue CPAP, then will need to get download off new machine to determine if he needs adjustment to pressure setting  Elevated blood pressure. - has mild systolic BP elevation today - advised him to f/u with his PCP   Patient Instructions  Will arrange for home sleep study and call with results  Follow up in 1 year   Chesley Mires, MD [facility] Pager:  [phone] 05/10/2017, 5:01 PM

## 2017-05-22 DIAGNOSIS — G4733 Obstructive sleep apnea (adult) (pediatric): Secondary | ICD-10-CM | POA: Diagnosis not present

## 2017-05-24 ENCOUNTER — Other Ambulatory Visit: Payer: Self-pay | Admitting: *Deleted

## 2017-05-24 ENCOUNTER — Telehealth: Payer: Self-pay

## 2017-05-24 ENCOUNTER — Telehealth: Payer: Self-pay | Admitting: Pulmonary Disease

## 2017-05-24 DIAGNOSIS — G4733 Obstructive sleep apnea (adult) (pediatric): Secondary | ICD-10-CM

## 2017-05-24 NOTE — Telephone Encounter (Signed)
HST 05/22/17 >> AHI 20.3, SaO2 low 85%   Will have my nurse inform pt that sleep study shows moderate sleep apnea.  He needs to continue using CPAP.  Please get copy of his last 30 days CPAP download.

## 2017-05-24 NOTE — Telephone Encounter (Signed)
I spoke with the patient who confirmed that he now sees Dr. Jani Gravel as his PCP.  VDM (DD)

## 2017-05-28 NOTE — Telephone Encounter (Signed)
Called and spoke with patient today regarding results per VS.  Informed the patient of her results today and recommendations per VS. The patient verbalized understanding and denied any questions or concerns at this time. Nothing further needed.  Called [facility] is have patient enrolled to [facility] today. Per Amy with [facility] she will be faxing over currently updated 30 day download on pt's CPAP machine today.

## 2017-06-03 DIAGNOSIS — E7800 Pure hypercholesterolemia, unspecified: Secondary | ICD-10-CM | POA: Diagnosis not present

## 2017-06-03 DIAGNOSIS — K7689 Other specified diseases of liver: Secondary | ICD-10-CM | POA: Diagnosis not present

## 2017-06-03 DIAGNOSIS — Z23 Encounter for immunization: Secondary | ICD-10-CM | POA: Diagnosis not present

## 2017-06-03 DIAGNOSIS — R972 Elevated prostate specific antigen [PSA]: Secondary | ICD-10-CM | POA: Diagnosis not present

## 2017-06-09 DIAGNOSIS — G4733 Obstructive sleep apnea (adult) (pediatric): Secondary | ICD-10-CM | POA: Diagnosis not present

## 2017-07-10 DIAGNOSIS — G4733 Obstructive sleep apnea (adult) (pediatric): Secondary | ICD-10-CM | POA: Diagnosis not present

## 2017-08-09 DIAGNOSIS — G4733 Obstructive sleep apnea (adult) (pediatric): Secondary | ICD-10-CM | POA: Diagnosis not present

## 2017-09-02 DIAGNOSIS — E7800 Pure hypercholesterolemia, unspecified: Secondary | ICD-10-CM | POA: Diagnosis not present

## 2017-09-02 DIAGNOSIS — R972 Elevated prostate specific antigen [PSA]: Secondary | ICD-10-CM | POA: Diagnosis not present

## 2017-09-05 DIAGNOSIS — R972 Elevated prostate specific antigen [PSA]: Secondary | ICD-10-CM | POA: Diagnosis not present

## 2017-09-05 DIAGNOSIS — R030 Elevated blood-pressure reading, without diagnosis of hypertension: Secondary | ICD-10-CM | POA: Diagnosis not present

## 2017-09-09 DIAGNOSIS — G4733 Obstructive sleep apnea (adult) (pediatric): Secondary | ICD-10-CM | POA: Diagnosis not present

## 2017-09-13 DIAGNOSIS — M170 Bilateral primary osteoarthritis of knee: Secondary | ICD-10-CM | POA: Diagnosis not present

## 2017-09-26 DIAGNOSIS — R030 Elevated blood-pressure reading, without diagnosis of hypertension: Secondary | ICD-10-CM | POA: Diagnosis not present

## 2017-10-03 DIAGNOSIS — I10 Essential (primary) hypertension: Secondary | ICD-10-CM | POA: Diagnosis not present

## 2017-10-10 DIAGNOSIS — G4733 Obstructive sleep apnea (adult) (pediatric): Secondary | ICD-10-CM | POA: Diagnosis not present

## 2017-10-21 DIAGNOSIS — M170 Bilateral primary osteoarthritis of knee: Secondary | ICD-10-CM | POA: Diagnosis not present

## 2017-10-28 DIAGNOSIS — M1711 Unilateral primary osteoarthritis, right knee: Secondary | ICD-10-CM | POA: Diagnosis not present

## 2017-10-28 DIAGNOSIS — M170 Bilateral primary osteoarthritis of knee: Secondary | ICD-10-CM | POA: Diagnosis not present

## 2017-10-28 DIAGNOSIS — M1712 Unilateral primary osteoarthritis, left knee: Secondary | ICD-10-CM | POA: Diagnosis not present

## 2017-11-04 DIAGNOSIS — M1712 Unilateral primary osteoarthritis, left knee: Secondary | ICD-10-CM | POA: Diagnosis not present

## 2017-11-04 DIAGNOSIS — M1711 Unilateral primary osteoarthritis, right knee: Secondary | ICD-10-CM | POA: Diagnosis not present

## 2017-11-04 DIAGNOSIS — M170 Bilateral primary osteoarthritis of knee: Secondary | ICD-10-CM | POA: Diagnosis not present

## 2017-11-06 DIAGNOSIS — R972 Elevated prostate specific antigen [PSA]: Secondary | ICD-10-CM | POA: Diagnosis not present

## 2017-11-07 DIAGNOSIS — G4733 Obstructive sleep apnea (adult) (pediatric): Secondary | ICD-10-CM | POA: Diagnosis not present

## 2017-11-15 DIAGNOSIS — C61 Malignant neoplasm of prostate: Secondary | ICD-10-CM | POA: Diagnosis not present

## 2017-11-15 DIAGNOSIS — R972 Elevated prostate specific antigen [PSA]: Secondary | ICD-10-CM | POA: Diagnosis not present

## 2017-12-08 DIAGNOSIS — G4733 Obstructive sleep apnea (adult) (pediatric): Secondary | ICD-10-CM | POA: Diagnosis not present

## 2017-12-10 DIAGNOSIS — C61 Malignant neoplasm of prostate: Secondary | ICD-10-CM | POA: Diagnosis not present

## 2017-12-16 DIAGNOSIS — Z0000 Encounter for general adult medical examination without abnormal findings: Secondary | ICD-10-CM | POA: Diagnosis not present

## 2017-12-16 DIAGNOSIS — C61 Malignant neoplasm of prostate: Secondary | ICD-10-CM | POA: Diagnosis not present

## 2017-12-16 DIAGNOSIS — I10 Essential (primary) hypertension: Secondary | ICD-10-CM | POA: Diagnosis not present

## 2017-12-16 DIAGNOSIS — M1711 Unilateral primary osteoarthritis, right knee: Secondary | ICD-10-CM | POA: Diagnosis not present

## 2017-12-16 DIAGNOSIS — M1712 Unilateral primary osteoarthritis, left knee: Secondary | ICD-10-CM | POA: Diagnosis not present

## 2017-12-16 DIAGNOSIS — H43811 Vitreous degeneration, right eye: Secondary | ICD-10-CM | POA: Diagnosis not present

## 2017-12-24 DIAGNOSIS — H43811 Vitreous degeneration, right eye: Secondary | ICD-10-CM | POA: Diagnosis not present

## 2017-12-24 DIAGNOSIS — H33301 Unspecified retinal break, right eye: Secondary | ICD-10-CM | POA: Diagnosis not present

## 2018-01-07 DIAGNOSIS — G4733 Obstructive sleep apnea (adult) (pediatric): Secondary | ICD-10-CM | POA: Diagnosis not present

## 2018-02-07 DIAGNOSIS — G4733 Obstructive sleep apnea (adult) (pediatric): Secondary | ICD-10-CM | POA: Diagnosis not present

## 2018-02-14 DIAGNOSIS — G4733 Obstructive sleep apnea (adult) (pediatric): Secondary | ICD-10-CM | POA: Diagnosis not present

## 2018-03-09 DIAGNOSIS — G4733 Obstructive sleep apnea (adult) (pediatric): Secondary | ICD-10-CM | POA: Diagnosis not present

## 2018-04-09 DIAGNOSIS — G4733 Obstructive sleep apnea (adult) (pediatric): Secondary | ICD-10-CM | POA: Diagnosis not present

## 2018-04-22 DIAGNOSIS — H43813 Vitreous degeneration, bilateral: Secondary | ICD-10-CM | POA: Diagnosis not present

## 2018-05-05 ENCOUNTER — Ambulatory Visit: Payer: PPO | Admitting: Pulmonary Disease

## 2018-05-05 ENCOUNTER — Encounter: Payer: Self-pay | Admitting: Pulmonary Disease

## 2018-05-05 VITALS — BP 118/74 | HR 75 | Ht 72.0 in | Wt 200.0 lb

## 2018-05-05 DIAGNOSIS — G4733 Obstructive sleep apnea (adult) (pediatric): Secondary | ICD-10-CM | POA: Diagnosis not present

## 2018-05-05 DIAGNOSIS — Z9989 Dependence on other enabling machines and devices: Secondary | ICD-10-CM

## 2018-05-05 DIAGNOSIS — Z789 Other specified health status: Secondary | ICD-10-CM

## 2018-05-05 NOTE — Progress Notes (Signed)
[facility]  Chief Complaint  Patient presents with  . Follow-up    Pt is not liking the new mask fit since rec'd the new machine.    Constitutional: BP 118/74 (BP Location: Left Arm, Cuff Size: Normal)   Pulse 75   Ht 6' (1.829 m)   Wt 200 lb (90.7 kg)   SpO2 96%   BMI 27.12 kg/m   History of Present Illness: Charles Salas is a 72 y.o. male with obstructive sleep apnea.  He uses CPAP nightly.  Uses his old CPAP when he travels to football games.  He has been using full face mask.  Current mask type causes pressure over his forehead.  He has to make mask tight to avoid air leak.  He tried nasal mask with chin strap, but this was uncomfortable.  He is not having sinus congestion, sore throat, dry mouth, or aerophagia.  Will be getting flu shot later this month with PCP.  Comprehensive Respiratory Exam:  Appearance - well kempt  ENMT - nasal mucosa moist, turbinates clear, midline nasal septum, no dental lesions, no gingival bleeding, no oral exudates, no tonsillar hypertrophy, MP 3, over bite, scalloped tongue Neck - no masses, trachea midline, no thyromegaly, no elevation in JVP Respiratory - normal appearance of chest wall, normal respiratory effort w/o accessory muscle use, no dullness on percussion, no wheezing or rales CV - s1s2 regular rate and rhythm, no murmurs, no peripheral edema, no varicosities, radial pulses symmetric GI - soft, non tender, no masses Lymph - no adenopathy noted in neck and axillary areas MSK - normal muscle strength and tone, normal gait Ext - no cyanosis, clubbing, or joint inflammation noted Skin - no rashes, lesions, or ulcers Neuro - oriented to person, place, and time Psych - normal mood and affect   Assessment/Plan:  Obstructive sleep apnea. - he is compliant with CPAP and reports benefit - he has trouble with full face mask - reviewed options to assist with mask fit - will try change from 8  to 6 cm H2O - if he is still having trouble, then will try changing to different CPAP mask   Patient Instructions  Will change CPAP to 6 cm H2O  Call or email if you have trouble after pressure change, or if you want to change to different CPAP mask  Can look up CPAP mask options on line  Follow up in 1 year    Chesley Mires, MD [place] 05/05/2018, 10:57 AM  Flow Sheet  Sleep tests: PSG 2013 >> AHI 34 CPAP 01/26/15 to 04/25/15 >>used on 86 of 90 nights with average 7 hrs and 30 min. Average AHI is 1.4 with CPAP 10 cm H2O HST 05/22/17 >> AHI 20.3, SaO2 low 85% CPAP 04/02/18 to 05/01/18 >> used on 26 of 30 nights with average 7 hrs 25 min.  Average AHI 1.5 with CPAP 8 cm H2O.  Cardiac tests: Echo 02/28/17 >> EF 55 to 60%, grade 1 DD  Past Medical History: He  has a past medical history of Corns and callosities, Dysphagia, Elevated liver enzymes (12/29/2014), Fracture of scapula, right, closed (2013), GERD (gastroesophageal reflux disease), Headache(784.0), Hyperlipidemia, Hypertension, PSA elevation (12/29/2014), RBBB (right bundle branch block), and Sleep apnea.  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  Family History: His family history includes Diabetes in his father and mother; Prostate cancer in his father; Stroke in his mother.  Social History: He  reports that he has never smoked. He has never used smokeless tobacco. He reports that he drinks alcohol. He reports that he does not use drugs.  Medications: Allergies as of 05/05/2018      Reactions   Augmentin [amoxicillin-pot Clavulanate] Diarrhea      Medication List        Accurate as of 05/05/18 10:57 AM. Always use your most recent med list.          FISH  OIL PO Take 1,800 mg by mouth daily.   lisinopril 10 MG tablet Commonly known as:  PRINIVIL,ZESTRIL Take 10 mg by mouth daily.   multivitamin with minerals tablet Take 1 tablet by mouth daily.   omeprazole 10 MG capsule Commonly known as:  PRILOSEC Take 10 mg by mouth daily.

## 2018-05-05 NOTE — Patient Instructions (Signed)
Will change CPAP to 6 cm H2O  Call or email if you have trouble after pressure change, or if you want to change to different CPAP mask  Can look up CPAP mask options on line  Follow up in 1 year

## 2018-05-12 ENCOUNTER — Other Ambulatory Visit: Payer: Self-pay | Admitting: Urology

## 2018-05-12 DIAGNOSIS — C61 Malignant neoplasm of prostate: Secondary | ICD-10-CM

## 2018-06-02 ENCOUNTER — Ambulatory Visit
Admission: RE | Admit: 2018-06-02 | Discharge: 2018-06-02 | Disposition: A | Discharge status: Home / Self Care | Payer: PPO | Source: Ambulatory Visit | Attending: Urology | Admitting: Urology

## 2018-06-02 DIAGNOSIS — C61 Malignant neoplasm of prostate: Secondary | ICD-10-CM

## 2018-06-06 DIAGNOSIS — C61 Malignant neoplasm of prostate: Secondary | ICD-10-CM | POA: Diagnosis not present

## 2018-06-08 ENCOUNTER — Ambulatory Visit
Admission: RE | Admit: 2018-06-08 | Discharge: 2018-06-08 | Disposition: A | Discharge status: Home / Self Care | Payer: PPO | Source: Ambulatory Visit | Attending: Urology | Admitting: Urology

## 2018-06-08 DIAGNOSIS — C61 Malignant neoplasm of prostate: Secondary | ICD-10-CM | POA: Diagnosis not present

## 2018-06-08 MED ORDER — GADOBENATE DIMEGLUMINE 529 MG/ML IV SOLN
19.0000 mL | Freq: Once | INTRAVENOUS | Status: AC | PRN
  Administered 2018-06-08: 19 mL via INTRAVENOUS

## 2018-06-13 DIAGNOSIS — C61 Malignant neoplasm of prostate: Secondary | ICD-10-CM | POA: Diagnosis not present

## 2018-06-19 DIAGNOSIS — Z0000 Encounter for general adult medical examination without abnormal findings: Secondary | ICD-10-CM | POA: Diagnosis not present

## 2018-06-19 DIAGNOSIS — I10 Essential (primary) hypertension: Secondary | ICD-10-CM | POA: Diagnosis not present

## 2018-06-26 DIAGNOSIS — G4733 Obstructive sleep apnea (adult) (pediatric): Secondary | ICD-10-CM | POA: Diagnosis not present

## 2018-06-26 DIAGNOSIS — E782 Mixed hyperlipidemia: Secondary | ICD-10-CM | POA: Diagnosis not present

## 2018-06-26 DIAGNOSIS — C61 Malignant neoplasm of prostate: Secondary | ICD-10-CM | POA: Diagnosis not present

## 2018-06-26 DIAGNOSIS — I10 Essential (primary) hypertension: Secondary | ICD-10-CM | POA: Diagnosis not present

## 2018-06-26 DIAGNOSIS — K219 Gastro-esophageal reflux disease without esophagitis: Secondary | ICD-10-CM | POA: Diagnosis not present

## 2018-06-26 DIAGNOSIS — Z0000 Encounter for general adult medical examination without abnormal findings: Secondary | ICD-10-CM | POA: Diagnosis not present

## 2018-07-25 DIAGNOSIS — C61 Malignant neoplasm of prostate: Secondary | ICD-10-CM | POA: Diagnosis not present

## 2018-08-05 DIAGNOSIS — C61 Malignant neoplasm of prostate: Secondary | ICD-10-CM | POA: Diagnosis not present

## 2018-08-25 ENCOUNTER — Encounter: Payer: Self-pay | Admitting: Radiation Oncology

## 2018-08-25 NOTE — Progress Notes (Addendum)
GU Location of Tumor / Histology: prostatic adenocarcinoma  If Prostate Cancer, Gleason Score is (3 + 4) and PSA is (6.69). Prostate volume: 25.5 cc  Charles Salas was found to have an elevated PSA of 6.41 in March 2019. Prostate biopsy revealed gleason 3+3 in 1 of 12 cores. Patient opted for active surveillance. December 2019 fusion biopsy revealed 3+4 in 3 of 12 cores with a slightly larger volume.   Biopsies of prostate (if applicable) revealed:    Past/Anticipated interventions by urology, if any: prostate biopsy, surveillance, MRI fusion biopsy, referral for consideration of radiotherapy, referral to Dr. Gershon Mussel Polascik to consideration of ablation therapy (doesn't have appointment yet)  Past/Anticipated interventions by medical oncology, if any: no  Weight changes, if any: no  Bowel/Bladder complaints, if any: IPSS 4. SHIM 14. Denies dysuria, hematuria, urinary leakage or incontinence.    Nausea/Vomiting, if any: no  Pain issues, if any:  no  SAFETY ISSUES:  Prior radiation? no  Pacemaker/ICD? no  Possible current pregnancy? no  Is the patient on methotrexate? no  Current Complaints / other details:  73 year old male. Retired from Program[employer], [employer]. Married to [place]. Works out five days per week and three of those days are with a Clinical research associate. Father diagnosed with prostate ca at the age of 69 and he died at 61 of unrelated causes.   Most interested in radioactive seeds.

## 2018-08-26 ENCOUNTER — Encounter: Payer: Self-pay | Admitting: Medical Oncology

## 2018-08-26 ENCOUNTER — Other Ambulatory Visit: Payer: Self-pay

## 2018-08-26 ENCOUNTER — Ambulatory Visit
Admission: RE | Admit: 2018-08-26 | Discharge: 2018-08-26 | Disposition: A | Discharge status: Home / Self Care | Payer: PPO | Source: Ambulatory Visit | Attending: Radiation Oncology | Admitting: Radiation Oncology

## 2018-08-26 ENCOUNTER — Encounter: Payer: Self-pay | Admitting: Radiation Oncology

## 2018-08-26 VITALS — BP 142/81 | HR 72 | Temp 98.4°F | Resp 20 | Ht 72.0 in | Wt 206.4 lb

## 2018-08-26 DIAGNOSIS — C61 Malignant neoplasm of prostate: Secondary | ICD-10-CM | POA: Insufficient documentation

## 2018-08-26 DIAGNOSIS — R972 Elevated prostate specific antigen [PSA]: Secondary | ICD-10-CM | POA: Diagnosis not present

## 2018-08-26 DIAGNOSIS — Z8042 Family history of malignant neoplasm of prostate: Secondary | ICD-10-CM | POA: Diagnosis not present

## 2018-08-26 HISTORY — DX: Malignant neoplasm of prostate: C61

## 2018-08-26 HISTORY — DX: Unspecified osteoarthritis, unspecified site: M19.90

## 2018-08-26 NOTE — Progress Notes (Signed)
Introduced myself to patient as the prostate nurse navigator and my role. He is not interested in surgery to treat his prostate cancer so here today to discuss his radiation options. He is most interested in brachytherapy. I gave him my business card and asked him to call me with questions or concerns. He voiced understanding.

## 2018-08-26 NOTE — Progress Notes (Signed)
See progress note under physician encounter. 

## 2018-08-26 NOTE — Progress Notes (Signed)
Radiation Oncology         ([phone]) [phone] ________________________________  Initial Outpatient Consultation  Name: Charles Salas MRN: [id number]  Date: 08/26/2018  DOB: [birth year]  CC:Kim, Jeneen Rinks, MD  Raynelle Bring, MD   REFERRING PHYSICIAN: Raynelle Bring, MD  DIAGNOSIS: 73 y.o. gentleman with Stage T1c adenocarcinoma of the prostate with Gleason score of 3+4, and PSA of 6.69.    ICD-10-CM   1. Malignant neoplasm of prostate [facility]) C61     HISTORY OF PRESENT ILLNESS: Charles Salas is a 73 y.o. male with a diagnosis of prostate cancer. He was noted to have an elevated PSA of 6.41 in March 2019 by his urologist, Dr. Alinda Money.  TRUS biopsy of the prostate on 11/15/2017 revealed Gleason 3+3 adenocarcinoma of the prostate in 1 of 12 cores (left mid lateral).  Given his low-grade disease at the time, the patient opted for active surveillance.   Follow-up PSA on 06/06/2018 was elevated slightly further to 6.69. The patient underwent Prostate MRI on 06/08/2018 which showed a 2.2 cm irregular low T2 lesion within the anterior central gland, abutting/involving the anterior fibromuscular stroma (PI-RADS 4). No findings suspicious for high-grade macroscopic prostate cancer within the peripheral gland. No evidence of seminal vesicle invasion, lymphadenopathy, or osseous metastases.  On follow-up with Dr. Alinda Money on 06/13/2018, a digital rectal examination was performed revealing no prostate nodularity. The patient then proceeded to MRI/US fusion biopsy on 07/25/2018 with all targeted biopsies negative for malignancy.  The prostate volume measured 25.5 cc.  Out of 28 core biopsies, 3 were positive.  The maximum Gleason score was 3+4, and this was seen in the 2 cores obtained from the left apex. Additionally, there was Gleason 3+3 disease in the one core in the left mid lateral. All ROI samples were benign.  Biopsies of prostate revealed:    The patient reviewed the biopsy results with his urologist and he has  kindly been referred today for discussion of potential radiation treatment options. He does have a pending referral to Dr. Thomos Lemons at [facility] for consideration of ablation therapy/HiFU but does not have an appointment scheduled yet.   PREVIOUS RADIATION THERAPY: No  PAST MEDICAL HISTORY:  Past Medical History:  Diagnosis Date  . Arthritis    bilateral knees  . Corns and callosities   . Dysphagia   . Elevated liver enzymes 12/29/2014  . Fracture of scapula, right, closed 2013  . GERD (gastroesophageal reflux disease)   . Headache(784.0)   . Hyperlipidemia   . Hypertension   . Prostate cancer ([place])   . PSA elevation 12/29/2014  . RBBB (right bundle branch block)   . Sleep apnea       PAST SURGICAL HISTORY: Past Surgical History:  Procedure Laterality Date  . Left knee arthroscopy Left 1985   Dr. Shellia Carwin  . left knee arthroscopy Left 1995   Dr. Shellia Carwin  . left shoulder surgery Left 2003   shaved collar bone; Dr. Shellia Carwin  . PROSTATE BIOPSY    . Right knee arthroscopy Right 1009   Dr. Shellia Carwin  . right knee arthroscopy    . 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 Caralyn Guile    FAMILY HISTORY:  Family History  Problem Relation Age of Onset  . Prostate cancer Father 59  . Diabetes Father   . Diabetes Mother   . Stroke Mother   . Breast cancer Sister   . Colon cancer Neg Hx   . Pancreatic cancer Neg Hx  SOCIAL HISTORY:  Social History   Socioeconomic History  . Marital status: Married    Spouse name: Olin Hauser  . Number of children: Not on file  . Years of education: Not on file  . Highest education level: Not on file  Occupational History  . Occupation: RETIRED Programmer, applications  Social Needs  . Financial resource strain: Not on file  . Food insecurity:    Worry: Not on file    Inability: Not on file  . Transportation needs:    Medical: Not on file    Non-medical: Not on file  Tobacco Use  . Smoking status: Never Smoker  .  Smokeless tobacco: Never Used  Substance and Sexual Activity  . Alcohol use: Yes    Comment: 5-6 drinks per week wine or liquor  . Drug use: No  . Sexual activity: Yes  Lifestyle  . Physical activity:    Days per week: Not on file    Minutes per session: Not on file  . Stress: Not on file  Relationships  . Social connections:    Talks on phone: Not on file    Gets together: Not on file    Attends religious service: Not on file    Active member of club or organization: Not on file    Attends meetings of clubs or organizations: Not on file    Relationship status: Not on file  . Intimate partner violence:    Fear of current or ex partner: Not on file    Emotionally abused: Not on file    Physically abused: Not on file    Forced sexual activity: Not on file  Other Topics Concern  . Not on file  Social History Narrative   Married - Olin Hauser   Never smoke   Alcohol 5-6 wine or liquor per week   Exercise 3 times a week with trainer, gym 5 days per week   POA, Living Will   Resides in [place].     ALLERGIES: Augmentin [amoxicillin-pot clavulanate]  MEDICATIONS:  Current Outpatient Medications  Medication Sig Dispense Refill  . lisinopril (PRINIVIL,ZESTRIL) 10 MG tablet Take 10 mg by mouth daily.    . Multiple Vitamins-Minerals (MULTIVITAMIN WITH MINERALS) tablet Take 1 tablet by mouth daily.    . Omega-3 Fatty Acids (FISH OIL PO) Take 1,800 mg by mouth daily.    Marland Kitchen omeprazole (PRILOSEC) 10 MG capsule Take 10 mg by mouth daily.     No current facility-administered medications for this encounter.     REVIEW OF SYSTEMS:  On review of systems, the patient reports that he is doing well overall. He denies any chest pain, shortness of breath, cough, fevers, chills, night sweats, or unintended weight changes. He denies any bowel disturbances, and denies abdominal pain, nausea or vomiting. He denies any new musculoskeletal or joint aches or pains. His IPSS was 4, indicating mild  urinary symptoms with incomplete emptying, frequency, intermittency, and urgency. He denies dysuria, hematuria, urinary leakage or incontinence. His SHIM was 14, indicating he does have some erectile dysfunction. A complete review of systems is obtained and is otherwise negative.    PHYSICAL EXAM:  Wt Readings from Last 3 Encounters:  08/26/18 206 lb 6.4 oz (93.6 kg)  05/05/18 200 lb (90.7 kg)  05/10/17 188 lb (85.3 kg)   Temp Readings from Last 3 Encounters:  08/26/18 98.4 F (36.9 C) (Oral)  01/23/17 97.7 F (36.5 C) (Oral)  06/26/16 97.7 F (36.5 C) (Oral)   BP Readings from  Last 3 Encounters:  08/26/18 (!) 142/81  05/05/18 118/74  05/10/17 (!) 142/78   Pulse Readings from Last 3 Encounters:  08/26/18 72  05/05/18 75  05/10/17 71   Pain Assessment Pain Score: 0-No pain/10  In general this is a well appearing caucasian male in no acute distress. He is alert and oriented x4 and appropriate throughout the examination. HEENT reveals that the patient is normocephalic, atraumatic. EOMs are intact. PERRLA. Skin is intact without any evidence of gross lesions. Cardiovascular exam reveals a regular rate and rhythm, no clicks rubs or murmurs are auscultated. Chest is clear to auscultation bilaterally. Lymphatic assessment is performed and does not reveal any adenopathy in the cervical, supraclavicular, axillary, or inguinal chains. Abdomen has active bowel sounds in all quadrants and is intact. The abdomen is soft, non tender, non distended. Lower extremities are negative for pretibial pitting edema, deep calf tenderness, cyanosis or clubbing.   KPS = 100  100 - Normal; no complaints; no evidence of disease. 90   - Able to carry on normal activity; minor signs or symptoms of disease. 80   - Normal activity with effort; some signs or symptoms of disease. 33   - Cares for self; unable to carry on normal activity or to do active work. 60   - Requires occasional assistance, but is able to  care for most of his personal needs. 50   - Requires considerable assistance and frequent medical care. 46   - Disabled; requires special care and assistance. 35   - Severely disabled; hospital admission is indicated although death not imminent. 74   - Very sick; hospital admission necessary; active supportive treatment necessary. 10   - Moribund; fatal processes progressing rapidly. 0     - Dead  Karnofsky DA, Abelmann Congress, Craver LS and [facility] [phone]) The use of the nitrogen mustards in the palliative treatment of carcinoma: with particular reference to bronchogenic carcinoma Cancer 1 634-56  LABORATORY DATA:  Lab Results  Component Value Date   WBC 3.6 12/17/2013   HGB 15.2 12/17/2013   HCT 43.3 12/17/2013   MCV 93 12/17/2013   Lab Results  Component Value Date   NA 137 01/21/2017   K 4.8 01/21/2017   CL 102 01/21/2017   CO2 24 01/21/2017   Lab Results  Component Value Date   ALT 16 01/21/2017   AST 26 01/21/2017   ALKPHOS 55 01/21/2017   BILITOT 0.7 01/21/2017     RADIOGRAPHY: No results found.    IMPRESSION/PLAN: 1. 73 y.o. gentleman with Stage T1c adenocarcinoma of the prostate with Gleason Score of 3+4, and PSA of 6.69. We discussed the patient's workup and outlined the nature of prostate cancer in this setting. The patient's T stage, Gleason's score, and PSA put him into the favorable-intermediate risk group. Accordingly, he is eligible for a variety of potential treatment options including brachytherapy or 5.5 weeks of daily external beam radiation. We discussed the available radiation techniques, and focused on the details of logistics and delivery. We discussed and outlined the risks, benefits, short and long-term effects associated with radiotherapy and compared and contrasted these with prostatectomy.  At the end of the conversation the patient is undecided regarding his final treatment preference but appears to be leaning towards brachytherapy. He expressed  some interest in a consult appointment with Dr. Thomos Lemons at [facility] (being arranged through Dr. Lynne Logan office) to discuss ablation therapy before making a final decision. We will follow up with him on his  final decision in the near future. He was encouraged to call with any questions or concerns in the interim.  We enjoyed meeting with him today and look forward to participating in his care should he elect to proceed with radiotherapy in the treatment of his disease.  We spent 45 minutes face to face with the patient and more than 50% of that time was spent in counseling and/or coordination of care.    Nicholos Johns, PA-C    Tyler Pita, MD  [facility] Direct Dial: [phone]  Fax: [phone] [email or web address]  Skype  [email or web address]  This document serves as a record of services personally performed by Tyler Pita, MD and Freeman Caldron, PA-C. It was created on their behalf by Rae Lips, a trained medical scribe. The creation of this record is based on the scribe's personal observations and the providers' statements to them. This document has been checked and approved by the attending providers.

## 2018-08-30 DIAGNOSIS — C61 Malignant neoplasm of prostate: Secondary | ICD-10-CM | POA: Insufficient documentation

## 2018-09-01 ENCOUNTER — Other Ambulatory Visit: Payer: Self-pay | Admitting: Urology

## 2018-09-04 ENCOUNTER — Telehealth: Payer: Self-pay | Admitting: Medical Oncology

## 2018-09-04 NOTE — Telephone Encounter (Signed)
Patient called asking about CT simulation/follow up appointment scheduled on My Chart. I explained what will happen during these appointments. He is aware of brachytherapy date 11/13/18. He voiced understanding and encouraged him to call back with questions or concerns.

## 2018-10-09 ENCOUNTER — Other Ambulatory Visit: Payer: Self-pay | Admitting: Urology

## 2018-10-09 DIAGNOSIS — C61 Malignant neoplasm of prostate: Secondary | ICD-10-CM

## 2018-10-15 IMAGING — CT CT HEAD W/O CM
3 of 4 series · 16 of 47 positions shown, 19 images · non-contrast
Comparison: None.

CLINICAL DATA: 70-year-old male with a history of right hand
weakness

EXAM:
CT HEAD WITHOUT CONTRAST
TECHNIQUE: Contiguous axial images were obtained from the base of the skull
through the vertex without intravenous contrast.

[Series 32: 3d filtered head w/o · axial · non-contrast · 0.49mm/px · z∈[-5,+140]mm · 10 of 35 slices shown, 13 images]
[im 3/35  brain]
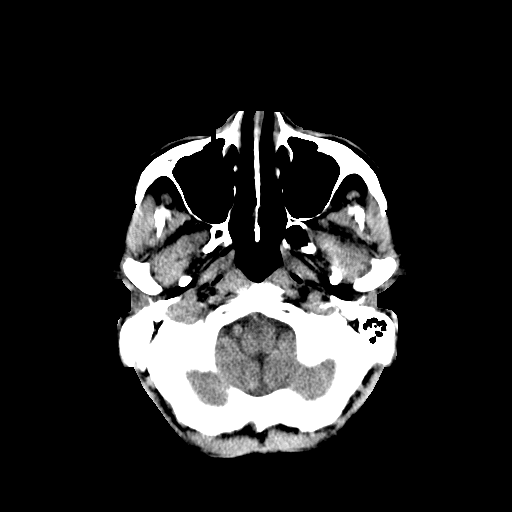
[im 3/35  bone]
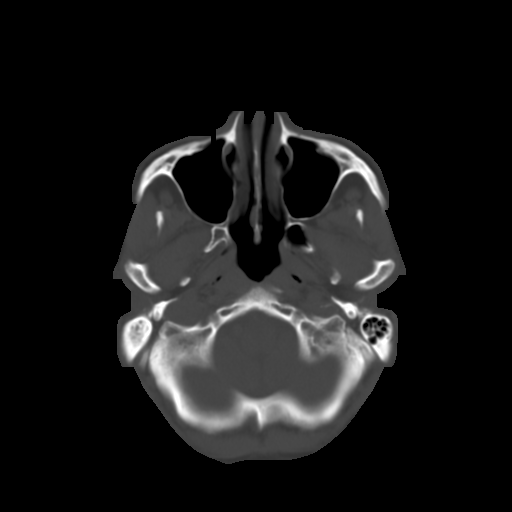
[im 5/35  brain]
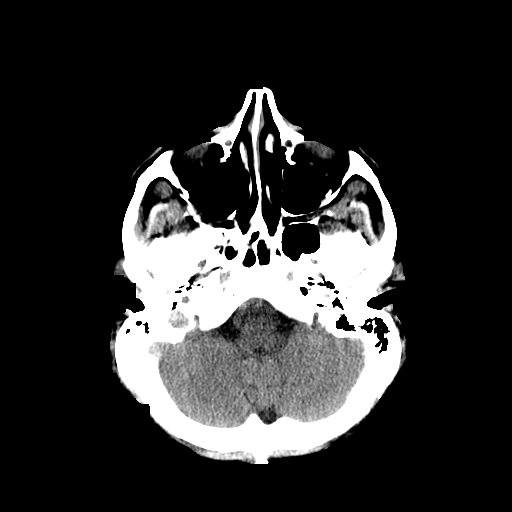
[im 10/35  brain]
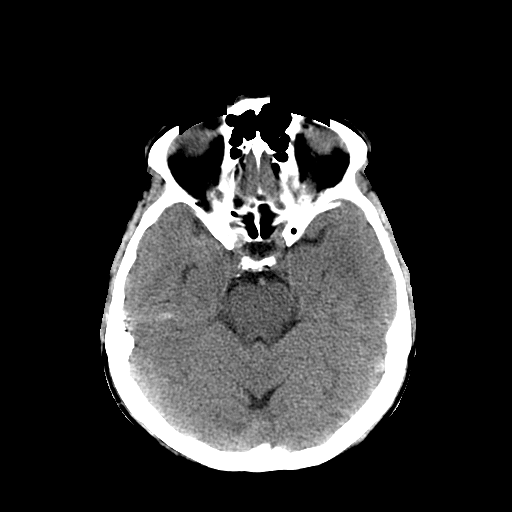
[im 13/35  brain]
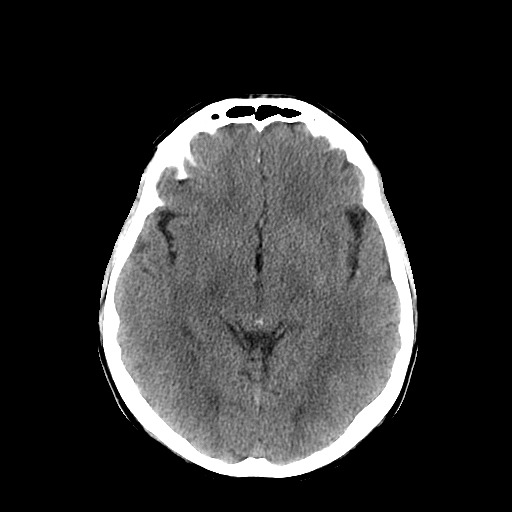
[im 15/35  brain]
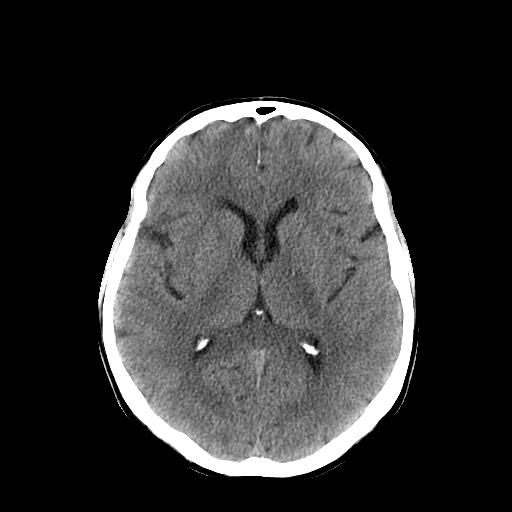
[im 15/35  bone]
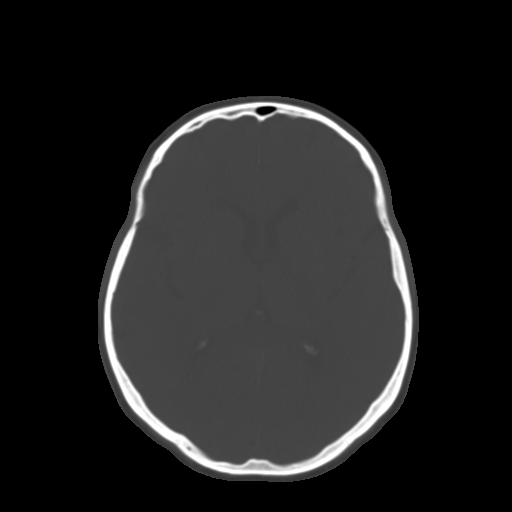
[im 20/35  brain]
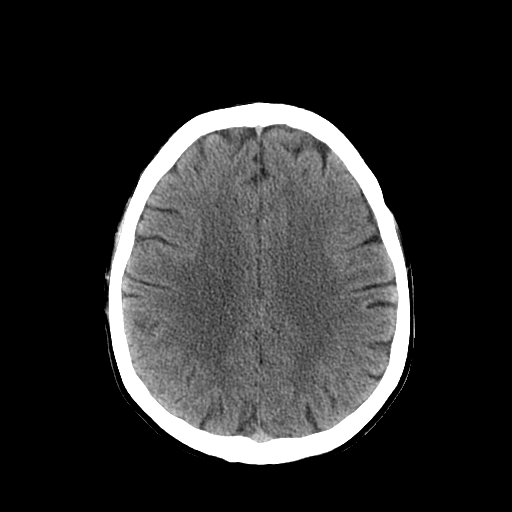
[im 22/35  brain]
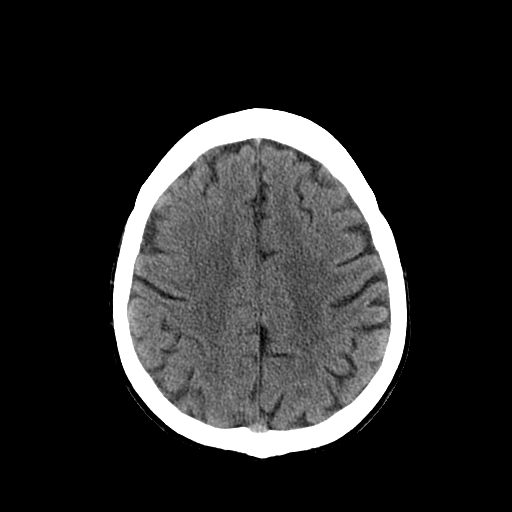
[im 25/35  brain]
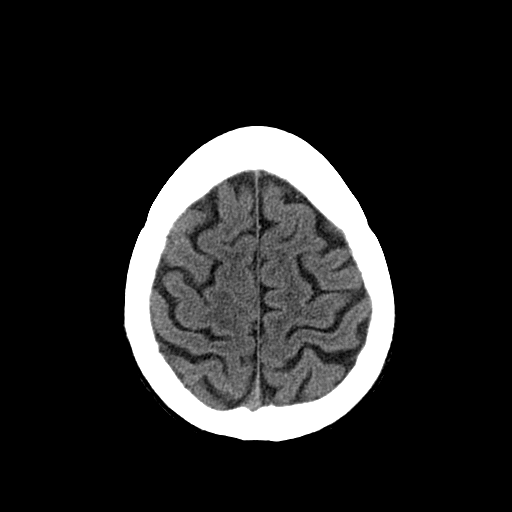
[im 30/35  brain]
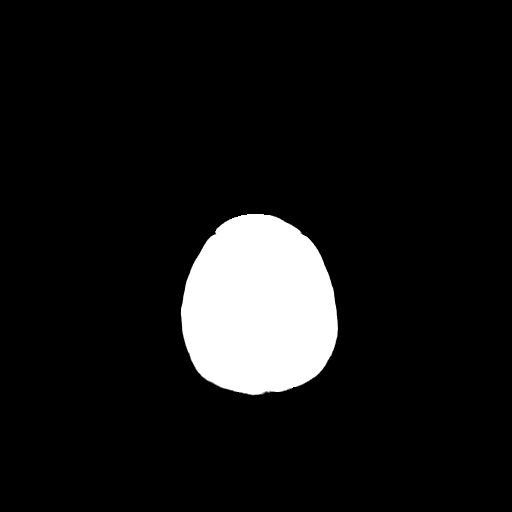
[im 30/35  bone]
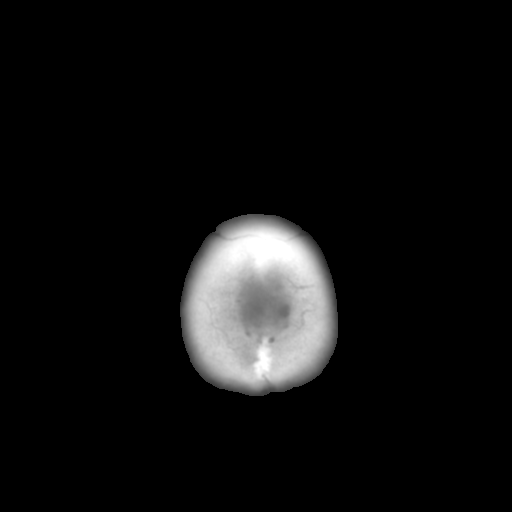
[im 32/35  brain]
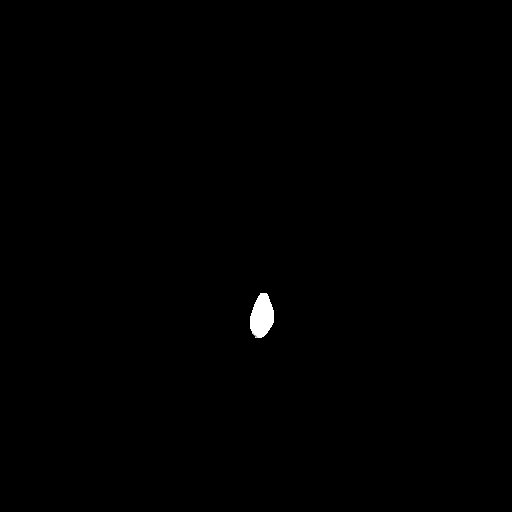

[Series 601: coronal brain · coronal · 0.49mm/px · 3 of 71 slices shown]
[im 24/71  brain]
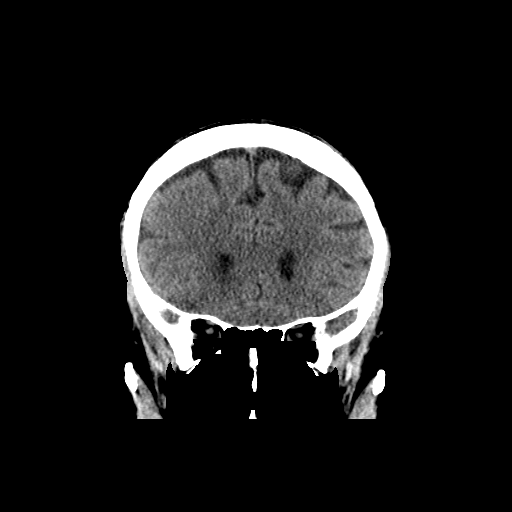
[im 32/71  brain]
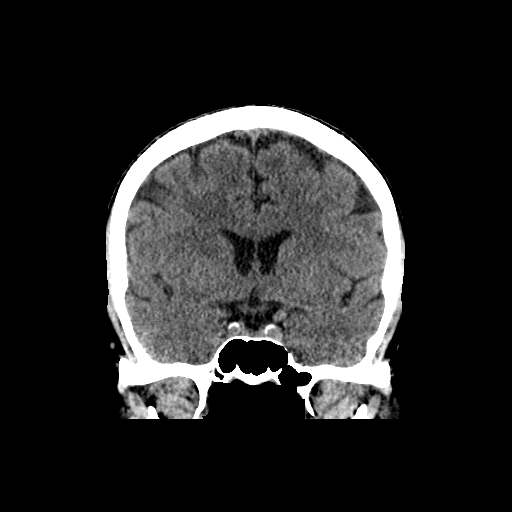
[im 39/71  brain]
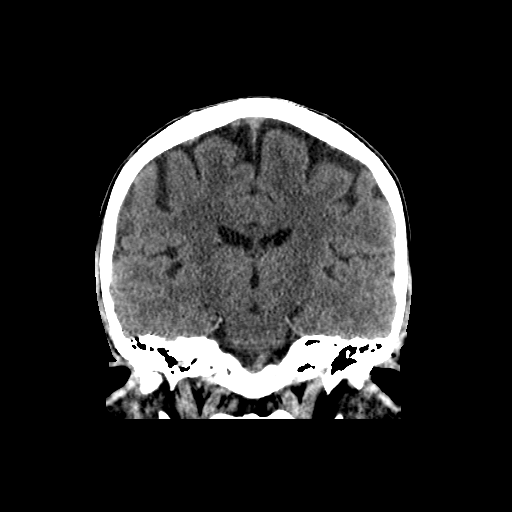

[Series 602: sagittal brain · sagittal · 0.49mm/px · 3 of 61 slices shown]
[im 21/61  brain]
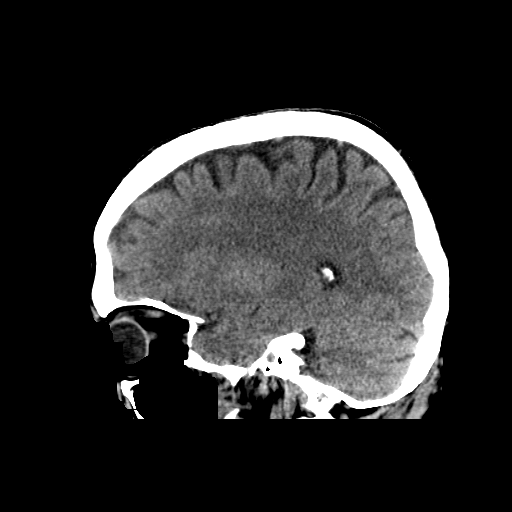
[im 31/61  brain]
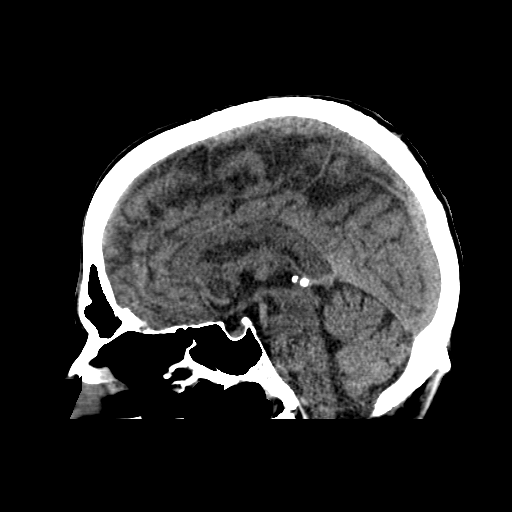
[im 41/61  brain]
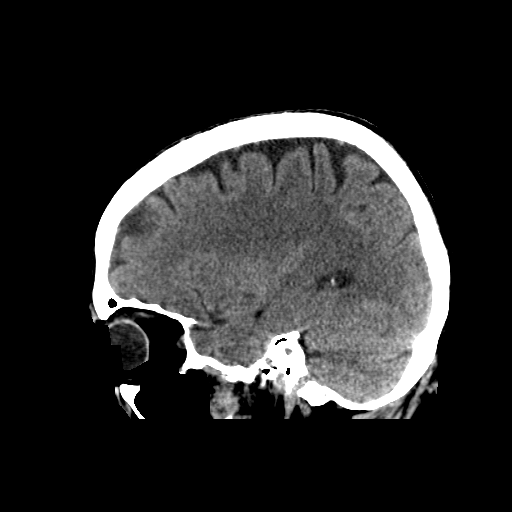

[16 of 47 positions shown; findings below may reference images not displayed]

FINDINGS: Brain: No acute intracranial hemorrhage. No midline shift or mass
effect. Gray-white differentiation maintained. Unremarkable
appearance of the ventricular system.

Vascular: Unremarkable.

Skull: No acute fracture.  No aggressive bone lesion identified.

Sinuses/Orbits: Unremarkable appearance of the orbits. Mastoid air
cells clear. No middle ear effusion. No significant sinus disease.

Other: None
IMPRESSION: No CT evidence of acute intracranial abnormality

## 2018-10-16 ENCOUNTER — Telehealth: Payer: Self-pay | Admitting: *Deleted

## 2018-10-16 NOTE — Telephone Encounter (Signed)
CALLED PATIENT TO REMIND OF PRE-SEED APPTS.  FOR 10-17-18, LVM FOR A RETURN CALL

## 2018-10-17 ENCOUNTER — Ambulatory Visit
Admission: RE | Admit: 2018-10-17 | Discharge: 2018-10-17 | Disposition: A | Discharge status: Home / Self Care | Payer: PPO | Source: Ambulatory Visit | Attending: Urology | Admitting: Urology

## 2018-10-17 ENCOUNTER — Other Ambulatory Visit: Payer: Self-pay

## 2018-10-17 ENCOUNTER — Encounter: Payer: Self-pay | Admitting: Medical Oncology

## 2018-10-17 ENCOUNTER — Ambulatory Visit (HOSPITAL_COMMUNITY)
Admission: RE | Admit: 2018-10-17 | Discharge: 2018-10-17 | Disposition: A | Discharge status: Home / Self Care | Payer: PPO | Source: Ambulatory Visit | Attending: Urology | Admitting: Urology

## 2018-10-17 ENCOUNTER — Encounter (HOSPITAL_COMMUNITY)
Admission: RE | Admit: 2018-10-17 | Discharge: 2018-10-17 | Disposition: A | Discharge status: Home / Self Care | Payer: PPO | Source: Ambulatory Visit | Attending: Urology | Admitting: Urology

## 2018-10-17 ENCOUNTER — Ambulatory Visit
Admission: RE | Admit: 2018-10-17 | Discharge: 2018-10-17 | Disposition: A | Discharge status: Home / Self Care | Payer: PPO | Source: Ambulatory Visit | Attending: Radiation Oncology | Admitting: Radiation Oncology

## 2018-10-17 VITALS — BP 143/84 | HR 62 | Temp 97.5°F | Resp 20

## 2018-10-17 DIAGNOSIS — C61 Malignant neoplasm of prostate: Secondary | ICD-10-CM

## 2018-10-17 DIAGNOSIS — I10 Essential (primary) hypertension: Secondary | ICD-10-CM | POA: Diagnosis not present

## 2018-10-17 NOTE — Progress Notes (Signed)
  Radiation Oncology         [phone] ________________________________  Name: Charles Salas MRN: [id number]  Date: 10/17/2018  DOB: 1945-09-19  SIMULATION AND TREATMENT PLANNING NOTE PUBIC ARCH STUDY  CC:Kim, Jeneen Rinks, MD  Raynelle Bring, MD  DIAGNOSIS: 73 y.o. gentleman with Stage T1c adenocarcinoma of the prostate with Gleason score of 3+4, and PSA of 6.69.     ICD-10-CM   1. Malignant neoplasm of prostate ([place]) C61     COMPLEX SIMULATION:  The patient presented today for evaluation for possible prostate seed implant. He was brought to the radiation planning suite and placed supine on the CT couch. A 3-dimensional image study set was obtained in upload to the planning computer. There, on each axial slice, I contoured the prostate gland. Then, using three-dimensional radiation planning tools I reconstructed the prostate in view of the structures from the transperineal needle pathway to assess for possible pubic arch interference. In doing so, I did not appreciate any pubic arch interference. Also, the patient's prostate volume was estimated based on the drawn structure. The volume was 25 cc.  Given the pubic arch appearance and prostate volume, patient remains a good candidate to proceed with prostate seed implant. Today, he freely provided informed written consent to proceed.    PLAN: The patient will undergo prostate seed implant.   ________________________________  Sheral Apley. Tammi Klippel, M.D.    This document serves as a record of services personally performed by Tyler Pita, MD. It was created on his behalf by Wilburn Mylar, a trained medical scribe. The creation of this record is based on the scribe's personal observations and the provider's statements to them. This document has been checked and approved by the attending provider.

## 2018-11-05 ENCOUNTER — Encounter (HOSPITAL_BASED_OUTPATIENT_CLINIC_OR_DEPARTMENT_OTHER): Payer: Self-pay

## 2018-11-05 ENCOUNTER — Other Ambulatory Visit: Payer: Self-pay

## 2018-11-05 ENCOUNTER — Telehealth: Payer: Self-pay | Admitting: *Deleted

## 2018-11-05 NOTE — Telephone Encounter (Signed)
CALLED PATIENT TO REMIND OF LAB APPT. FOR 11-06-18 - ARRIVAL TIME- 8:30 AM @ [facility] ADMITTING, SPOKE WITH PATIENT AND HE IS AWARE OF THIS APPT.

## 2018-11-05 NOTE — Progress Notes (Signed)
Patient contacted via phone re scheduled surgery for 11/13/18 At 1300pm.  Medical and surgical history reviewed. Preop instructions re NPO after midnight,oral care and valuables policy given w/ patient voicing understanding.  Instructed re preop Fleets enema 3 hrs. preop am of surgery.  To arrive to [facility] at 1100am with photo ID and insurance card. Discussed pre- and postop expectations.  Questions addressed.  Patient verbalizes understanding.  To notify surgeon and [facility] @[phone]  if condition changes or any concerns.

## 2018-11-06 ENCOUNTER — Encounter (HOSPITAL_COMMUNITY)
Admission: RE | Admit: 2018-11-06 | Discharge: 2018-11-06 | Disposition: A | Discharge status: Home / Self Care | Payer: PPO | Source: Ambulatory Visit | Attending: Urology | Admitting: Urology

## 2018-11-06 DIAGNOSIS — Z01812 Encounter for preprocedural laboratory examination: Secondary | ICD-10-CM | POA: Diagnosis not present

## 2018-11-06 LAB — PROTIME-INR
INR: 1 (ref 0.8–1.2)
PROTHROMBIN TIME: 13.1 s (ref 11.4–15.2)

## 2018-11-06 LAB — CBC
HCT: 45.6 % (ref 39.0–52.0)
Hemoglobin: 15.6 g/dL (ref 13.0–17.0)
MCH: 32.6 pg (ref 26.0–34.0)
MCHC: 34.2 g/dL (ref 30.0–36.0)
MCV: 95.4 fL (ref 80.0–100.0)
Platelets: 262 10*3/uL (ref 150–400)
RBC: 4.78 MIL/uL (ref 4.22–5.81)
RDW: 12 % (ref 11.5–15.5)
WBC: 4 10*3/uL (ref 4.0–10.5)
nRBC: 0 % (ref 0.0–0.2)

## 2018-11-06 LAB — COMPREHENSIVE METABOLIC PANEL
ALT: 19 U/L (ref 0–44)
AST: 26 U/L (ref 15–41)
Albumin: 4.2 g/dL (ref 3.5–5.0)
Alkaline Phosphatase: 66 U/L (ref 38–126)
Anion gap: 10 (ref 5–15)
BILIRUBIN TOTAL: 0.6 mg/dL (ref 0.3–1.2)
BUN: 18 mg/dL (ref 8–23)
CO2: 21 mmol/L — ABNORMAL LOW (ref 22–32)
Calcium: 9.4 mg/dL (ref 8.9–10.3)
Chloride: 107 mmol/L (ref 98–111)
Creatinine, Ser: 1.09 mg/dL (ref 0.61–1.24)
GFR calc Af Amer: >60 mL/min (ref >60)
GFR calc non Af Amer: >60 mL/min (ref >60)
Glucose, Bld: 106 mg/dL — ABNORMAL HIGH (ref 70–99)
Potassium: 4.6 mmol/L (ref 3.5–5.1)
Sodium: 138 mmol/L (ref 135–145)
TOTAL PROTEIN: 7 g/dL (ref 6.5–8.1)

## 2018-11-06 LAB — APTT: aPTT: 29 seconds (ref 24–36)

## 2018-11-12 ENCOUNTER — Telehealth: Payer: Self-pay | Admitting: *Deleted

## 2018-11-12 NOTE — Telephone Encounter (Signed)
CALLED PATIENT TO REMIND OF PROCEDURE FOR 11-13-18, SPOKE WITH PATIENT AND HE IS AWARE OF THIS PROCEDURE

## 2018-11-12 NOTE — H&P (Signed)
CC/HPI: Prostate cancer    He was found to have an elevated PSA of 6.41. He underwent a TRUS biopsy of the prostate on 11/15/17 that confirmed Gleason 3+3=6 adenocarcinoma of the prostate in 1 out of 12 biopsy cores. He elected to proceed with active surveillance management. He subsequently underwent a confirmatory evaluation with an MRI of the prostate that demonstrated a 2.2 cm anterior central gland PI RADS for lesion. He underwent an MR/ultrasound fusion biopsy with all targeted biopsies negative for malignancy. However, he did have 3 cores that were positive on extended systematic sampling of the prostate that were positive for malignancy. This included a Gleason 6 area in the left lateral mid gland as previously noted. However, he did have 2 cores that were positive at the left apex for Gleason 3+4=7 disease with 10% of pattern 4 involvement.   He follows up today for further discussion after his most recent biopsy and diagnosis of upgraded disease.   Family history: Father - diagnosed at age 85 and died at 60 of unrelated causes.   PMH: He has a history of arthritis, GERD, and sleep apnea.  PSH: No abdominal surgeries.   Initial diagnosis: March 2019  TNM stage: cT1c Nx Mx  PSA: 6.41  Gleason score: 3+3=6  Biopsy (11/15/17): 1/12 cores positive  Left: L lateral mid (10%)  Right: Benign  Prostate volume: 24.9 cc  PSAD: 0.26   Baseline urinary function: IPSS is 8.  Baseline erectile function: SHIM score is 6. Not recently sexually active.   Surveillance:  Oct 2019: MRI - 2.2 cm anterior central gland PI-RADS 4 lesion  Dec 2019: MR/US fusion biopsy - 3/28 cores positive, Vol 25.5 cc  Left: L apex (2/2 cores, 3+4=7, 10% and 5%), L lateral mid (1/2 cores, 3+3=6, 10%)  Right: Benign  Targeted: Benign     ALLERGIES: Augmentin    MEDICATIONS: Diazepam 10 mg tablet Take one tablet 30-60 minutes prior to procedure  Lisinopril 10 mg tablet     GU PSH: Prostate Needle Biopsy -  07/25/2018, 11/15/2017    NON-GU PSH: Surgical Pathology, Gross And Microscopic Examination For Prostate Needle - 07/25/2018, 11/15/2017    GU PMH: Prostate Cancer - 12/10/2017      PMH Notes:   1) Prostate cancer: He was found to have an elevated PSA of 6.41. He underwent a TRUS biopsy of the prostate on 11/15/17 that confirmed Gleason 3+3=6 adenocarcinoma of the prostate in 1 out of 12 biopsy cores. He elected to proceed with active surveillance management.   Family history: Father - diagnosed at age 51 and died at 44 of unrelated causes.   PMH: He has a history of arthritis, GERD, and sleep apnea.  PSH: No abdominal surgeries.   Initial diagnosis: March 2019  TNM stage: cT1c Nx Mx  PSA: 6.41  Gleason score: 3+3=6  Biopsy (11/15/17): 1/12 cores positive  Left: L lateral mid (10%)  Right: Benign  Prostate volume: 24.9 cc  PSAD: 0.26   Baseline urinary function: IPSS is 8.  Baseline erectile function: SHIM score is 6. Not recently sexually active.   Surveillance:  Oct 2019: MRI - 2.2 cm anterior central gland PI-RADS 4 lesion  Dec 2019: MR/US fusion biopsy -     NON-GU PMH: Arthritis GERD Hypertension Sleep Apnea    FAMILY HISTORY: Family History Unknown    SOCIAL HISTORY: Marital Status: Married Preferred Language: English; Ethnicity: Not Hispanic Or Latino; Race: White Current Smoking Status: Patient has never smoked.  Tobacco Use Assessment Completed: Used Tobacco in last 30 days? Social Drinker.  Drinks 2 caffeinated drinks per day.    REVIEW OF SYSTEMS:    GU Review Male:   Patient denies burning/ pain with urination, hard to postpone urination, get up at night to urinate, stream starts and stops, frequent urination, leakage of urine, have to strain to urinate , and trouble starting your streams.  Gastrointestinal (Upper):   Patient denies nausea and vomiting.  Gastrointestinal (Lower):   Patient denies diarrhea and constipation.  Constitutional:   Patient  denies fever, night sweats, weight loss, and fatigue.  Skin:   Patient denies skin rash/ lesion and itching.  Eyes:   Patient denies blurred vision and double vision.  Ears/ Nose/ Throat:   Patient denies sore throat and sinus problems.  Hematologic/Lymphatic:   Patient denies swollen glands and easy bruising.  Cardiovascular:   Patient denies leg swelling and chest pains.  Respiratory:   Patient denies cough and shortness of breath.  Endocrine:   Patient denies excessive thirst.  Musculoskeletal:   Patient denies back pain and joint pain.  Neurological:   Patient denies headaches and dizziness.  Psychologic:   Patient denies depression and anxiety.   VITAL SIGNS:     Weight 200 lb / 90.72 kg  Height 72 in / 182.88 cm  BMI 27.1 kg/m   MULTI-SYSTEM PHYSICAL EXAMINATION:    Constitutional: Well-nourished. No physical deformities. Normally developed. Good grooming.  CV: RRR Lungs: Clear bilaterally  ASSESSMENT:      ICD-10 Details  1 GU:   Prostate Cancer - C61    PLAN:        1. Favorable, intermediate risk prostate cancer:  Mr. Mccorkel has elected to proceed with a radiation seed implantation for treatment of his prostate cancer. This will be scheduled in conjunction with Dr. Tammi Klippel and will proceed with SpaceOAR insertion and cystoscopy.

## 2018-11-13 ENCOUNTER — Ambulatory Visit (HOSPITAL_COMMUNITY)
Admission: RE | Admit: 2018-11-13 | Discharge: 2018-11-13 | Disposition: A | Discharge status: Home / Self Care | Payer: PPO | Attending: Urology | Admitting: Urology

## 2018-11-13 ENCOUNTER — Encounter (HOSPITAL_COMMUNITY)
Admission: RE | Disposition: A | Discharge status: Home / Self Care | Payer: Self-pay | Source: Home / Self Care | Attending: Urology

## 2018-11-13 ENCOUNTER — Ambulatory Visit (HOSPITAL_COMMUNITY): Payer: PPO | Admitting: Anesthesiology

## 2018-11-13 ENCOUNTER — Other Ambulatory Visit: Payer: Self-pay

## 2018-11-13 ENCOUNTER — Ambulatory Visit (HOSPITAL_COMMUNITY): Payer: PPO

## 2018-11-13 ENCOUNTER — Encounter: Payer: Self-pay | Admitting: Medical Oncology

## 2018-11-13 ENCOUNTER — Encounter (HOSPITAL_COMMUNITY): Payer: Self-pay | Admitting: *Deleted

## 2018-11-13 DIAGNOSIS — C61 Malignant neoplasm of prostate: Secondary | ICD-10-CM | POA: Insufficient documentation

## 2018-11-13 DIAGNOSIS — K219 Gastro-esophageal reflux disease without esophagitis: Secondary | ICD-10-CM | POA: Diagnosis not present

## 2018-11-13 DIAGNOSIS — G4730 Sleep apnea, unspecified: Secondary | ICD-10-CM | POA: Diagnosis not present

## 2018-11-13 DIAGNOSIS — Z79899 Other long term (current) drug therapy: Secondary | ICD-10-CM | POA: Diagnosis not present

## 2018-11-13 DIAGNOSIS — I10 Essential (primary) hypertension: Secondary | ICD-10-CM | POA: Diagnosis not present

## 2018-11-13 HISTORY — PX: RADIOACTIVE SEED IMPLANT: SHX5150

## 2018-11-13 HISTORY — PX: CYSTOSCOPY: SHX5120

## 2018-11-13 HISTORY — PX: SPACE OAR INSTILLATION: SHX6769

## 2018-11-13 SURGERY — INSERTION, RADIATION SOURCE, PROSTATE
Anesthesia: General

## 2018-11-13 MED ORDER — HYDROMORPHONE HCL 1 MG/ML IJ SOLN: 0.2500 mg | INTRAMUSCULAR | Status: DC | PRN

## 2018-11-13 MED ORDER — ROCURONIUM BROMIDE 100 MG/10ML IV SOLN
INTRAVENOUS | Status: DC | PRN
  Administered 2018-11-13: 80 mg via INTRAVENOUS
  Administered 2018-11-13: 20 mg via INTRAVENOUS

## 2018-11-13 MED ORDER — PROPOFOL 10 MG/ML IV BOLUS
INTRAVENOUS | Status: DC | PRN
  Administered 2018-11-13: 130 mg via INTRAVENOUS

## 2018-11-13 MED ORDER — EPHEDRINE 5 MG/ML INJ
INTRAVENOUS | Status: AC
  Filled 2018-11-13: qty 10

## 2018-11-13 MED ORDER — MIDAZOLAM HCL 2 MG/2ML IJ SOLN
INTRAMUSCULAR | Status: AC
  Filled 2018-11-13: qty 2

## 2018-11-13 MED ORDER — IOHEXOL 300 MG/ML  SOLN
INTRAMUSCULAR | Status: DC | PRN
  Administered 2018-11-13: 7 mL

## 2018-11-13 MED ORDER — TRAMADOL HCL 50 MG PO TABS: 50.0000 mg | ORAL_TABLET | Freq: Four times a day (QID) | ORAL | 0 refills | Status: DC | PRN

## 2018-11-13 MED ORDER — LIDOCAINE 2% (20 MG/ML) 5 ML SYRINGE
INTRAMUSCULAR | Status: DC | PRN
  Administered 2018-11-13: 100 mg via INTRAVENOUS

## 2018-11-13 MED ORDER — FENTANYL CITRATE (PF) 250 MCG/5ML IJ SOLN
INTRAMUSCULAR | Status: AC
  Filled 2018-11-13: qty 5

## 2018-11-13 MED ORDER — FENTANYL CITRATE (PF) 250 MCG/5ML IJ SOLN
INTRAMUSCULAR | Status: DC | PRN
  Administered 2018-11-13: 100 ug via INTRAVENOUS
  Administered 2018-11-13 (×2): 50 ug via INTRAVENOUS

## 2018-11-13 MED ORDER — SILODOSIN 4 MG PO CAPS: 4.0000 mg | ORAL_CAPSULE | Freq: Every day | ORAL | 0 refills | Status: DC

## 2018-11-13 MED ORDER — SUGAMMADEX SODIUM 200 MG/2ML IV SOLN
INTRAVENOUS | Status: DC | PRN
  Administered 2018-11-13: 400 mg via INTRAVENOUS

## 2018-11-13 MED ORDER — ONDANSETRON HCL 4 MG/2ML IJ SOLN
INTRAMUSCULAR | Status: DC | PRN
  Administered 2018-11-13: 4 mg via INTRAVENOUS

## 2018-11-13 MED ORDER — MIDAZOLAM HCL 2 MG/2ML IJ SOLN
INTRAMUSCULAR | Status: DC | PRN
  Administered 2018-11-13: 2 mg via INTRAVENOUS

## 2018-11-13 MED ORDER — EPHEDRINE SULFATE-NACL 50-0.9 MG/10ML-% IV SOSY
PREFILLED_SYRINGE | INTRAVENOUS | Status: DC | PRN
  Administered 2018-11-13: 10 mg via INTRAVENOUS
  Administered 2018-11-13: 5 mg via INTRAVENOUS

## 2018-11-13 MED ORDER — STERILE WATER FOR IRRIGATION IR SOLN
Status: DC | PRN
  Administered 2018-11-13: 1000 mL

## 2018-11-13 MED ORDER — MEPERIDINE HCL 50 MG/ML IJ SOLN: 6.2500 mg | INTRAMUSCULAR | Status: DC | PRN

## 2018-11-13 MED ORDER — SUGAMMADEX SODIUM 200 MG/2ML IV SOLN
INTRAVENOUS | Status: AC
  Filled 2018-11-13: qty 2

## 2018-11-13 MED ORDER — PROMETHAZINE HCL 25 MG/ML IJ SOLN: 6.2500 mg | INTRAMUSCULAR | Status: DC | PRN

## 2018-11-13 MED ORDER — ONDANSETRON HCL 4 MG/2ML IJ SOLN
INTRAMUSCULAR | Status: AC
  Filled 2018-11-13: qty 2

## 2018-11-13 MED ORDER — LIDOCAINE 2% (20 MG/ML) 5 ML SYRINGE
INTRAMUSCULAR | Status: AC
  Filled 2018-11-13: qty 5

## 2018-11-13 MED ORDER — LACTATED RINGERS IV SOLN
INTRAVENOUS | Status: DC
  Administered 2018-11-13 (×2): via INTRAVENOUS

## 2018-11-13 MED ORDER — CIPROFLOXACIN IN D5W 400 MG/200ML IV SOLN
400.0000 mg | INTRAVENOUS | Status: AC
  Administered 2018-11-13: 400 mg via INTRAVENOUS
  Filled 2018-11-13: qty 200

## 2018-11-13 MED ORDER — OXYCODONE HCL 5 MG PO TABS: 5.0000 mg | ORAL_TABLET | Freq: Once | ORAL | Status: DC | PRN

## 2018-11-13 MED ORDER — STERILE WATER FOR IRRIGATION IR SOLN
Status: DC | PRN
  Administered 2018-11-13: 3000 mL

## 2018-11-13 MED ORDER — DEXAMETHASONE SODIUM PHOSPHATE 10 MG/ML IJ SOLN
INTRAMUSCULAR | Status: AC
  Filled 2018-11-13: qty 1

## 2018-11-13 MED ORDER — OXYCODONE HCL 5 MG/5ML PO SOLN: 5.0000 mg | Freq: Once | ORAL | Status: DC | PRN

## 2018-11-13 MED ORDER — DEXAMETHASONE SODIUM PHOSPHATE 10 MG/ML IJ SOLN
INTRAMUSCULAR | Status: DC | PRN
  Administered 2018-11-13: 10 mg via INTRAVENOUS

## 2018-11-13 MED ORDER — FLEET ENEMA 7-19 GM/118ML RE ENEM
1.0000 | ENEMA | Freq: Once | RECTAL | Status: AC
  Administered 2018-11-13: 1 via RECTAL
  Filled 2018-11-13: qty 1

## 2018-11-13 MED ORDER — PROPOFOL 10 MG/ML IV BOLUS
INTRAVENOUS | Status: AC
  Filled 2018-11-13: qty 20

## 2018-11-13 SURGICAL SUPPLY — 23 items
BAG URINE DRAINAGE (UROLOGICAL SUPPLIES) ×4 IMPLANT
CATH FOLEY 2WAY SLVR  5CC 16FR (CATHETERS) ×4
CATH FOLEY 2WAY SLVR 5CC 16FR (CATHETERS) ×4 IMPLANT
CATH ROBINSON RED A/P 20FR (CATHETERS) ×4 IMPLANT
CONT SPEC 4OZ CLIKSEAL STRL BL (MISCELLANEOUS) ×6 IMPLANT
COVER BACK TABLE 60X90IN (DRAPES) ×4 IMPLANT
COVER MAYO STAND STRL (DRAPES) ×4 IMPLANT
COVER SURGICAL LIGHT HANDLE (MISCELLANEOUS) ×2 IMPLANT
COVER WAND RF STERILE (DRAPES) IMPLANT
DRSG TEGADERM 4X4.75 (GAUZE/BANDAGES/DRESSINGS) ×4 IMPLANT
DRSG TEGADERM 8X12 (GAUZE/BANDAGES/DRESSINGS) ×6 IMPLANT
GLOVE BIOGEL M STRL SZ7.5 (GLOVE) ×8 IMPLANT
GOWN STRL REUS W/TWL LRG LVL3 (GOWN DISPOSABLE) ×8 IMPLANT
HOLDER FOLEY CATH W/STRAP (MISCELLANEOUS) ×2 IMPLANT
I-seed AgX100 Radionuclide Brachytherapy Source ×2 IMPLANT
IMPL SPACEOAR SYSTEM 10ML (Spacer) IMPLANT
IMPLANT SPACEOAR SYSTEM 10ML (Spacer) ×4 IMPLANT
KIT TURNOVER KIT A (KITS) ×2 IMPLANT
MARKER SKIN DUAL TIP RULER LAB (MISCELLANEOUS) ×4 IMPLANT
PACK CYSTO (CUSTOM PROCEDURE TRAY) ×4 IMPLANT
SYR 10ML LL (SYRINGE) ×4 IMPLANT
TOWEL OR 17X26 10 PK STRL BLUE (TOWEL DISPOSABLE) ×4 IMPLANT
UNDERPAD 30X30 (UNDERPADS AND DIAPERS) ×2 IMPLANT

## 2018-11-13 NOTE — Transfer of Care (Signed)
Immediate Anesthesia Transfer of Care Note  Patient: Charles Salas  Procedure(s) Performed: RADIOACTIVE SEED IMPLANT/BRACHYTHERAPY IMPLANT (N/A ) SPACE OAR INSTILLATION (N/A ) CYSTOSCOPY  Patient Location: PACU  Anesthesia Type:General  Level of Consciousness: awake, alert  and oriented  Airway & Oxygen Therapy: Patient Spontanous Breathing and Patient connected to face mask oxygen  Post-op Assessment: Report given to RN and Post -op Vital signs reviewed and stable  Post vital signs: Reviewed and stable  Last Vitals:  Vitals Value Taken Time  BP 135/74 11/13/2018  2:46 PM  Temp    Pulse 80 11/13/2018  2:47 PM  Resp 17 11/13/2018  2:47 PM  SpO2 100 % 11/13/2018  2:47 PM  Vitals shown include unvalidated device data.  Last Pain:  Vitals:   11/13/18 1137  TempSrc:   PainSc: 0-No pain         Complications: No apparent anesthesia complications

## 2018-11-13 NOTE — Op Note (Signed)
Preoperative diagnosis: Clinically localized adenocarcinoma of the prostate (cT1c Nx Mx)  Postoperative diagnosis: Clinically localized adenocarcinoma of the prostate  Procedure: 1) Transperineal placement of radioactive seeds into the prostate                    2) Cystoscopy                    3) Insertion of SpaceOAR hydrogel   Surgeon: Pryor Curia. M.D.  Radiation oncologist: Dr. Tyler Pita  Anesthesia: General  EBL: Minimal  Complications: None  Indication: Charles Salas is a 73 y.o. gentleman with clinically localized prostate cancer. After discussing management options for treatment, he elected to proceed with radiotherapy. He presents today for the above procedures. The potential risks, complications, alternative options, and expected recovery course have been discussed in detail with the patient and he has provided informed consent to proceed.  Description of procedure: The patient was taken to the operating room and general anesthesia was induced. He was administered preoperative antibiotics, placed in the dorsal lithotomy position, and prepped and draped in the usual sterile fashion. Next, intraoperative transrectal ultrasonography was utilized for real-time intraoperative planning by the radiation oncology team. Once the treatment plan was completed and the seed strands created, stranded iodine 125 radiation seeds were placed utilizing a brachytherapy perineal template. 65 radioactive iodine 125 seeds into the prostate through 27 catheter needles.  The brachytherapy template was then removed.  A site in the midline was selected on the perineum for placement of an 18 g needle with saline.  The needle was advanced above the rectum and below Denonvillier's fascia to the mid gland and confirmed to be in the midline on transverse imaging.  One cc of saline was injected confirming appropriate expansion of this space.  A total of 5 cc of saline was then injected to  open the space further bilaterally.  The saline syringe was then removed and the SpaceOAR hydrogel was injected with good distribution bilaterally. Position of the radiation seeds was confirmed on fluoroscopic imaging.  Flexible cystoscopy was then performed and no seeds were identified within the bladder.  No bladder tumors, stones, or other mucosal pathology was identified within the bladder. He tolerated the procedure well and without complications. He was able to be transferred to the recovery unit in satisfactory condition.  He was given a voiding trial in the PACU.

## 2018-11-13 NOTE — Anesthesia Procedure Notes (Addendum)
Procedure Name: Intubation Date/Time: 11/13/2018 1:15 PM Performed by: Sharlette Dense, CRNA Patient Re-evaluated:Patient Re-evaluated prior to induction Oxygen Delivery Method: Circle system utilized Preoxygenation: Pre-oxygenation with 100% oxygen Induction Type: IV induction and Rapid sequence Grade View: Grade III Tube size: 8.0 mm Number of attempts: 1 Airway Equipment and Method: Stylet Placement Confirmation: ETT inserted through vocal cords under direct vision,  positive ETCO2 and breath sounds checked- equal and bilateral Secured at: 21 cm Tube secured with: Tape Dental Injury: Teeth and Oropharynx as per pre-operative assessment  Comments: Anterior airway with cords seen with tracheal pressure

## 2018-11-13 NOTE — Anesthesia Preprocedure Evaluation (Signed)
Anesthesia Evaluation  Patient identified by MRN, date of birth, ID band Patient awake    Reviewed: Allergy & Precautions, NPO status , Patient's Chart, lab work & pertinent test results  Airway Mallampati: II  TM Distance: >3 FB Neck ROM: Full    Dental  (+) Dental Advisory Given, Teeth Intact   Pulmonary sleep apnea ,    Pulmonary exam normal breath sounds clear to auscultation       Cardiovascular hypertension, Pt. on medications Normal cardiovascular exam+ dysrhythmias  Rhythm:Regular Rate:Normal     Neuro/Psych  Headaches, negative psych ROS   GI/Hepatic Neg liver ROS, GERD  Medicated,  Endo/Other  negative endocrine ROS  Renal/GU negative Renal ROS     Musculoskeletal  (+) Arthritis ,   Abdominal   Peds  Hematology negative hematology ROS (+)   Anesthesia Other Findings   Reproductive/Obstetrics                             Anesthesia Physical Anesthesia Plan  ASA: II  Anesthesia Plan: General   Post-op Pain Management:    Induction: Intravenous and Rapid sequence  PONV Risk Score and Plan: 4 or greater and Ondansetron, Dexamethasone and Treatment may vary due to age or medical condition  Airway Management Planned: Oral ETT  Additional Equipment: None  Intra-op Plan:   Post-operative Plan: Extubation in OR  Informed Consent: I have reviewed the patients History and Physical, chart, labs and discussed the procedure including the risks, benefits and alternatives for the proposed anesthesia with the patient or authorized representative who has indicated his/her understanding and acceptance.     Dental advisory given  Plan Discussed with: CRNA  Anesthesia Plan Comments:         Anesthesia Quick Evaluation

## 2018-11-13 NOTE — Anesthesia Postprocedure Evaluation (Signed)
Anesthesia Post Note  Patient: Charles Salas  Procedure(s) Performed: RADIOACTIVE SEED IMPLANT/BRACHYTHERAPY IMPLANT (N/A ) SPACE OAR INSTILLATION (N/A ) CYSTOSCOPY     Patient location during evaluation: PACU Anesthesia Type: General Level of consciousness: sedated and patient cooperative Pain management: pain level controlled Vital Signs Assessment: post-procedure vital signs reviewed and stable Respiratory status: spontaneous breathing Cardiovascular status: stable Anesthetic complications: no    Last Vitals:  Vitals:   11/13/18 1515 11/13/18 1520  BP: 122/72 126/82  Pulse: 68 77  Resp: 11 19  Temp: (!) 36.3 C (!) 36.3 C  SpO2: 96% 98%    Last Pain:  Vitals:   11/13/18 1520  TempSrc:   PainSc: 0-No pain                 Nolon Nations

## 2018-11-13 NOTE — Discharge Instructions (Addendum)
°  You will be prescribed silodosin which is a medication to help you urinate over the next month.  You should call Dr. Lynne Logan office [phone]) if you feel you cannot empty your bladder well. Also, call if you develop fever > 101.  You will also be prescribed pain medication and a stool softener to take initially after the procedure.  Followup with Dr. Alinda Money and your radiation oncologist as scheduled.

## 2018-11-14 ENCOUNTER — Encounter (HOSPITAL_COMMUNITY): Payer: Self-pay | Admitting: Urology

## 2018-11-15 NOTE — OR Nursing (Signed)
Opened chart per Roberto Scales RN3, BSN to see who the staff in the room was.  Tennelle Taflinger, RN3, BSN

## 2018-11-16 NOTE — Progress Notes (Signed)
  Radiation Oncology         [phone] ________________________________  Name: Aidenjames Heckmann III MRN: [id number]  Date: 11/16/2018  DOB: 03-Apr-1946       Prostate Seed Implant  CC:Kim, Jeneen Rinks, MD  No ref. provider found  DIAGNOSIS: 73 y.o. gentleman with Stage T1c adenocarcinoma of the prostate with Gleason score of 3+4, and PSA of 6.69.    ICD-10-CM   1. Prostate cancer ([place]) C61 DG Chest 2 View    DG Chest 2 View    PROCEDURE: Insertion of radioactive I-125 seeds into the prostate gland.  RADIATION DOSE: 145 Gy, definitive therapy.  TECHNIQUE: Zamir Staples III was brought to the operating room with the urologist. He was placed in the dorsolithotomy position. He was catheterized and a rectal tube was inserted. The perineum was shaved, prepped and draped. The ultrasound probe was then introduced into the rectum to see the prostate gland.  TREATMENT DEVICE: A needle grid was attached to the ultrasound probe stand and anchor needles were placed.  3D PLANNING: The prostate was imaged in 3D using a sagittal sweep of the prostate probe. These images were transferred to the planning computer. There, the prostate, urethra and rectum were defined on each axial reconstructed image. Then, the software created an optimized 3D plan and a few seed positions were adjusted. The quality of the plan was reviewed using [facility] information for the target and the following two organs at risk:  Urethra and Rectum.  Then the accepted plan was printed and handed off to the radiation therapist.  Under my supervision, the custom loading of the seeds and spacers was carried out and loaded into sealed vicryl sleeves.  These pre-loaded needles were then placed into the needle holder.Marland Kitchen  PROSTATE VOLUME STUDY:  Using transrectal ultrasound the volume of the prostate was verified to be 31.5 cc.  SPECIAL TREATMENT PROCEDURE/SUPERVISION AND HANDLING: The pre-loaded needles were then delivered under sagittal  guidance. A total of 27 needles were used to deposit 75 seeds in the prostate gland. The individual seed activity was 0.351 mCi.  SpaceOAR:  Yes  COMPLEX SIMULATION: At the end of the procedure, an anterior radiograph of the pelvis was obtained to document seed positioning and count. Cystoscopy was performed to check the urethra and bladder.  MICRODOSIMETRY: At the end of the procedure, the patient was emitting 0.131 mR/hr at 1 meter. Accordingly, he was considered safe for hospital discharge.  PLAN: The patient will return to the radiation oncology clinic for post implant CT dosimetry in three weeks.   ________________________________  Sheral Apley Tammi Klippel, M.D.

## 2018-11-20 DIAGNOSIS — G4733 Obstructive sleep apnea (adult) (pediatric): Secondary | ICD-10-CM | POA: Diagnosis not present

## 2018-12-02 ENCOUNTER — Telehealth: Payer: Self-pay | Admitting: *Deleted

## 2018-12-02 NOTE — Telephone Encounter (Signed)
CALLED PATIENT TO REMIND OF APPTS. FOR 12-03-18 AND 12-04-18, LVM FOR A RETURN CALL

## 2018-12-03 ENCOUNTER — Ambulatory Visit
Admission: RE | Admit: 2018-12-03 | Discharge: 2018-12-03 | Disposition: A | Discharge status: Home / Self Care | Payer: PPO | Source: Ambulatory Visit | Attending: Radiation Oncology | Admitting: Radiation Oncology

## 2018-12-03 ENCOUNTER — Other Ambulatory Visit: Payer: Self-pay

## 2018-12-03 ENCOUNTER — Ambulatory Visit: Payer: PPO | Admitting: Urology

## 2018-12-03 ENCOUNTER — Telehealth: Payer: Self-pay | Admitting: Urology

## 2018-12-03 DIAGNOSIS — C61 Malignant neoplasm of prostate: Secondary | ICD-10-CM | POA: Insufficient documentation

## 2018-12-03 NOTE — Telephone Encounter (Signed)
Radiation Oncology         [phone] ________________________________  Name: Charles Salas MRN: [id number]  Date: 12/03/2018  DOB: 29-Nov-1945  Post-Seed Follow-Up Visit Note  CC: Charles Gravel, MD  No ref. provider found  Diagnosis:   73 y.o. gentleman with Stage T1c adenocarcinoma of the prostate with Gleason score of 3+4, and PSA of 6.69.    ICD-10-CM   1. Malignant neoplasm of prostate ([facility]) C61     Interval Since Last Radiation:  3 weeks 11/13/18: Insertion of radioactive I-125 seeds into the prostate gland; 145 Gy, definitive therapy with SpaceOAR gel placement.  Narrative:  I spoke with the patient to conduct his routine scheduled post-seed follow up visit via telephone to spare the patient unnecessary potential exposure in the healthcare setting during the current COVID-19 pandemic.  The patient was notified in advance and gave permission to proceed with this visit format. He is complaining of increased urinary frequency, nocturia, urgency and urinary hesitation symptoms. He specifically denies gross hematuria, dysuria, weak stream, straining to void, incomplete bladder emptying or incontinence.  He reports nocturia 2-3 times per night which is his most bothersome symptom but reportedly tolerable at this point.  He reports a healthy appetite and is intentionally losing weight following a strict weight watchers diet.  He reports having lost approximately 13 pounds over the last 4 to 6 weeks which he is quite pleased with.  He denies abdominal pain, nausea, vomiting, diarrhea or constipation.  Overall, he is quite pleased with his progress to date.  ALLERGIES:  is allergic to augmentin [amoxicillin-pot clavulanate].  Meds: Current Outpatient Medications  Medication Sig Dispense Refill  . lisinopril (PRINIVIL,ZESTRIL) 10 MG tablet Take 10 mg by mouth daily.    . Multiple Vitamins-Minerals (MULTIVITAMIN WITH MINERALS) tablet Take 1 tablet by mouth daily.    . Omega-3 Fatty  Acids (FISH OIL PO) Take 1,800 mg by mouth daily.    Marland Kitchen omeprazole (PRILOSEC) 10 MG capsule Take 10 mg by mouth daily.    . silodosin (RAPAFLO) 4 MG CAPS capsule Take 1 capsule (4 mg total) by mouth at bedtime. 30 capsule 0  . traMADol (ULTRAM) 50 MG tablet Take 1-2 tablets (50-100 mg total) by mouth every 6 (six) hours as needed (pain). 8 tablet 0   No current facility-administered medications for this visit.     Physical Findings: Unable to assess due to telephone visit format.  Lab Findings: Lab Results  Component Value Date   WBC 4.0 11/06/2018   HGB 15.6 11/06/2018   HCT 45.6 11/06/2018   MCV 95.4 11/06/2018   PLT 262 11/06/2018    Radiographic Findings:  Patient underwent CT imaging in our clinic for post implant dosimetry. The CT will be reviewed by Dr. Tammi Klippel to ensure an adequate distribution of radioactive seeds throughout the prostate gland and confirm that there are no seeds in or near the rectum.  He is scheduled for an MRI of the prostate on 12/04/18 and these images will be fused with his CT images for further evaluation.  We suspect the final radiation plan and dosimetry will show appropriate coverage of the prostate gland.  He understands that we will call him should there be any unanticipated findings on his recent imaging upon further evaluation.  Impression/Plan: 73 y.o. gentleman with Stage T1c adenocarcinoma of the prostate with Gleason score of 3+4, and PSA of 6.69. The patient is recovering from the effects of radiation. His urinary symptoms should gradually improve over  the next 4-6 months. We talked about this today. He is encouraged by his improvement already and is otherwise pleased with his outcome. We also talked about long-term follow-up for prostate cancer following seed implant. He understands that ongoing PSA determinations and digital rectal exams will help perform surveillance to rule out disease recurrence. He does not currently have a follow up  appointment scheduled with Dr. Alinda Money but anticipates a follow-up visit in June or July for repeat PSA. He understands what to expect with his PSA measures. Patient was also educated today about some of the long-term effects from radiation including a small risk for rectal bleeding and possibly erectile dysfunction. We talked about some of the general management approaches to these potential complications. However, I did encourage the patient to contact our office or return at any point if he has questions or concerns related to his previous radiation and prostate cancer.    Nicholos Johns, PA-C

## 2018-12-03 NOTE — Progress Notes (Signed)
  Radiation Oncology         [phone] ________________________________  Name: Eleuterio Dollar III MRN: [id number]  Date: 12/03/2018  DOB: 07/25/1946  COMPLEX SIMULATION NOTE  NARRATIVE:  The patient was brought to the [facility] today following prostate seed implantation approximately one month ago.  Identity was confirmed.  All relevant records and images related to the planned course of therapy were reviewed.  Then, the patient was set-up supine.  CT images were obtained.  The CT images were loaded into the planning software.  Then the prostate and rectum were contoured.  Treatment planning then occurred.  The implanted iodine 125 seeds were identified by the physics staff for projection of radiation distribution  I have requested : 3D Simulation  I have requested a DVH of the following structures: Prostate and rectum.    ________________________________  Sheral Apley Tammi Klippel, M.D.  This document serves as a record of services personally performed by Tyler Pita, MD. It was created on his behalf by Wilburn Mylar, a trained medical scribe. The creation of this record is based on the scribe's personal observations and the provider's statements to them. This document has been checked and approved by the attending provider.

## 2018-12-04 ENCOUNTER — Ambulatory Visit (HOSPITAL_COMMUNITY)
Admission: RE | Admit: 2018-12-04 | Discharge: 2018-12-04 | Disposition: A | Discharge status: Home / Self Care | Payer: PPO | Source: Ambulatory Visit | Attending: Urology | Admitting: Urology

## 2018-12-04 DIAGNOSIS — C61 Malignant neoplasm of prostate: Secondary | ICD-10-CM | POA: Diagnosis not present

## 2018-12-08 ENCOUNTER — Ambulatory Visit (HOSPITAL_COMMUNITY): Payer: PPO

## 2018-12-10 DIAGNOSIS — C61 Malignant neoplasm of prostate: Secondary | ICD-10-CM | POA: Diagnosis not present

## 2018-12-19 DIAGNOSIS — I10 Essential (primary) hypertension: Secondary | ICD-10-CM | POA: Diagnosis not present

## 2018-12-19 DIAGNOSIS — K7689 Other specified diseases of liver: Secondary | ICD-10-CM | POA: Diagnosis not present

## 2018-12-19 DIAGNOSIS — E7800 Pure hypercholesterolemia, unspecified: Secondary | ICD-10-CM | POA: Diagnosis not present

## 2018-12-20 DIAGNOSIS — S61213A Laceration without foreign body of left middle finger without damage to nail, initial encounter: Secondary | ICD-10-CM | POA: Diagnosis not present

## 2018-12-20 DIAGNOSIS — Z23 Encounter for immunization: Secondary | ICD-10-CM | POA: Diagnosis not present

## 2018-12-23 DIAGNOSIS — S61213A Laceration without foreign body of left middle finger without damage to nail, initial encounter: Secondary | ICD-10-CM | POA: Diagnosis not present

## 2018-12-25 DIAGNOSIS — G4733 Obstructive sleep apnea (adult) (pediatric): Secondary | ICD-10-CM | POA: Diagnosis not present

## 2018-12-25 DIAGNOSIS — C61 Malignant neoplasm of prostate: Secondary | ICD-10-CM | POA: Diagnosis not present

## 2018-12-25 DIAGNOSIS — K7689 Other specified diseases of liver: Secondary | ICD-10-CM | POA: Diagnosis not present

## 2018-12-25 DIAGNOSIS — E7800 Pure hypercholesterolemia, unspecified: Secondary | ICD-10-CM | POA: Diagnosis not present

## 2018-12-25 DIAGNOSIS — K219 Gastro-esophageal reflux disease without esophagitis: Secondary | ICD-10-CM | POA: Diagnosis not present

## 2018-12-25 DIAGNOSIS — I10 Essential (primary) hypertension: Secondary | ICD-10-CM | POA: Diagnosis not present

## 2019-01-14 ENCOUNTER — Encounter: Payer: Self-pay | Admitting: Radiation Oncology

## 2019-01-14 DIAGNOSIS — C61 Malignant neoplasm of prostate: Secondary | ICD-10-CM | POA: Diagnosis not present

## 2019-01-16 NOTE — Progress Notes (Signed)
  Radiation Oncology         [phone] ________________________________  Name: Charles Salas MRN: [id number]  Date: 01/14/2019  DOB: 1946/04/18  3D Planning Note   Prostate Brachytherapy Post-Implant Dosimetry  Diagnosis: 73 y.o. gentleman with Stage T1c adenocarcinoma of the prostate with Gleason score of 3+4, and PSA of 6.69.  Narrative: On a previous date, Charles Salas returned following prostate seed implantation for post implant planning. He underwent CT scan complex simulation to delineate the three-dimensional structures of the pelvis and demonstrate the radiation distribution.  Since that time, the seed localization, and complex isodose planning with dose volume histograms have now been completed.  Results:   Prostate Coverage - The dose of radiation delivered to the 90% or more of the prostate gland (D90) was 98.23% of the prescription dose. This exceeds our goal of greater than 90%. Rectal Sparing - The volume of rectal tissue receiving the prescription dose or higher was 0.0 cc. This falls under our thresholds tolerance of 1.0 cc.  Impression: The prostate seed implant appears to show adequate target coverage and appropriate rectal sparing.  Plan:  The patient will continue to follow with urology for ongoing PSA determinations. I would anticipate a high likelihood for local tumor control with minimal risk for rectal morbidity.  ________________________________  Sheral Apley Tammi Klippel, M.D.

## 2019-01-23 DIAGNOSIS — M545 Low back pain: Secondary | ICD-10-CM | POA: Diagnosis not present

## 2019-01-27 DIAGNOSIS — H43811 Vitreous degeneration, right eye: Secondary | ICD-10-CM | POA: Diagnosis not present

## 2019-01-27 DIAGNOSIS — H25813 Combined forms of age-related cataract, bilateral: Secondary | ICD-10-CM | POA: Diagnosis not present

## 2019-01-27 DIAGNOSIS — H35363 Drusen (degenerative) of macula, bilateral: Secondary | ICD-10-CM | POA: Diagnosis not present

## 2019-01-27 DIAGNOSIS — H35371 Puckering of macula, right eye: Secondary | ICD-10-CM | POA: Diagnosis not present

## 2019-01-27 DIAGNOSIS — H33321 Round hole, right eye: Secondary | ICD-10-CM | POA: Diagnosis not present

## 2019-01-27 DIAGNOSIS — H31091 Other chorioretinal scars, right eye: Secondary | ICD-10-CM | POA: Diagnosis not present

## 2019-02-18 DIAGNOSIS — G4733 Obstructive sleep apnea (adult) (pediatric): Secondary | ICD-10-CM | POA: Diagnosis not present

## 2019-05-12 ENCOUNTER — Ambulatory Visit (INDEPENDENT_AMBULATORY_CARE_PROVIDER_SITE_OTHER): Payer: PPO | Admitting: Primary Care

## 2019-05-12 ENCOUNTER — Other Ambulatory Visit: Payer: Self-pay

## 2019-05-12 ENCOUNTER — Encounter: Payer: Self-pay | Admitting: Primary Care

## 2019-05-12 VITALS — BP 120/74 | HR 56 | Ht 72.0 in | Wt 183.2 lb

## 2019-05-12 DIAGNOSIS — R0982 Postnasal drip: Secondary | ICD-10-CM | POA: Diagnosis not present

## 2019-05-12 DIAGNOSIS — Z9989 Dependence on other enabling machines and devices: Secondary | ICD-10-CM

## 2019-05-12 DIAGNOSIS — G4733 Obstructive sleep apnea (adult) (pediatric): Secondary | ICD-10-CM | POA: Diagnosis not present

## 2019-05-12 NOTE — Assessment & Plan Note (Signed)
-   Try ocean nasal spray 1 puff per nostril prior to bedtime

## 2019-05-12 NOTE — Progress Notes (Signed)
@Patient  ID: Charles Salas, male    DOB: May 05, 1946, 73 y.o.   MRN: [id number]  Chief Complaint  Patient presents with  . Follow-up    pt doing well on cpap, denies any current complaints.      Referring provider: Jani Gravel, MD  HPI: 73 year old male, never smoked. PMH significant for OSA on CPAP, dysphagia, GERD, RBBB, hyperlipidemia, malignant neoplasm of prostate. Patient of Dr. Halford Chessman, last seen on 05/06/19. HST 05/22/17 >> AHI 20.3, SaO2 low 85%. Maintained on CPAP at 6cm H20.   05/12/2019 Patient presents today for 1 year follow-up sleep apnea. He is doing well, no acute complaints. He is 100% compliant with CPAP use. No issues with pressure setting or mask fit. He is sleeping very well at night with no nocturnal awakenings. Receives auto supplies through DME company[employer]. Reports some nasal drainage at night, he is not big on taking medications if not needed. He does have a netti pot and uses flonase occasionally. No sinus infection symptoms.   Airview download: 30/30 days; 97% >4 hours Average use 6 hours 20 mins Pressure 6cm H20 AHI 3.2  Allergies  Allergen Reactions  . Augmentin [Amoxicillin-Pot Clavulanate] Diarrhea    Immunization History  Administered Date(s) Administered  . DTaP 01/19/2015  . Influenza Split 05/21/2011, 06/20/2012  . Influenza Whole 06/16/2013  . Influenza,inj,Quad PF,6+ Mos 05/04/2015, 06/26/2016  . Influenza-Unspecified 06/05/2014  . Pneumococcal Conjugate-13 05/04/2015  . Pneumococcal Polysaccharide-23 06/26/2016  . Zoster 05/21/2011    Past Medical History:  Diagnosis Date  . Arthritis    bilateral knees  . Corns and callosities   . Dysphagia   . Elevated liver enzymes 12/29/2014  . Fracture of scapula, right, closed 2013  . GERD (gastroesophageal reflux disease)   . Headache(784.0)   . Hyperlipidemia   . Hypertension   . Prostate cancer ([place])   . PSA elevation 12/29/2014  . RBBB (right bundle branch block)   . Sleep  apnea     Tobacco History: Social History   Tobacco Use  Smoking Status Never Smoker  Smokeless Tobacco Never Used   Counseling given: Not Answered   Outpatient Medications Prior to Visit  Medication Sig Dispense Refill  . lisinopril (PRINIVIL,ZESTRIL) 10 MG tablet Take 20 mg by mouth daily.     . Multiple Vitamins-Minerals (MULTIVITAMIN WITH MINERALS) tablet Take 1 tablet by mouth daily.    . Omega-3 Fatty Acids (FISH OIL PO) Take 1,800 mg by mouth daily.    . silodosin (RAPAFLO) 4 MG CAPS capsule Take 1 capsule (4 mg total) by mouth at bedtime. 30 capsule 0  . traMADol (ULTRAM) 50 MG tablet Take 1-2 tablets (50-100 mg total) by mouth every 6 (six) hours as needed (pain). 8 tablet 0  . omeprazole (PRILOSEC) 10 MG capsule Take 10 mg by mouth daily.     No facility-administered medications prior to visit.    Review of Systems  Review of Systems  Constitutional: Negative.   HENT: Positive for postnasal drip. Negative for congestion.   Respiratory: Negative for cough, shortness of breath and wheezing.   Cardiovascular: Negative.   Psychiatric/Behavioral: Negative.    Physical Exam  BP 120/74 (Cuff Size: Normal)   Pulse (!) 56   Ht 6' (1.829 m)   Wt 183 lb 3.2 oz (83.1 kg)   SpO2 100%   BMI 24.85 kg/m  Physical Exam Constitutional:      Appearance: Normal appearance. He is not ill-appearing.  HENT:  Head: Normocephalic and atraumatic.     Nose: Nose normal.     Mouth/Throat:     Mouth: Mucous membranes are moist.     Pharynx: Oropharynx is clear.     Comments: Mallampati class I Neck:     Musculoskeletal: Normal range of motion and neck supple.  Cardiovascular:     Rate and Rhythm: Normal rate and regular rhythm.  Pulmonary:     Effort: Pulmonary effort is normal.     Breath sounds: Normal breath sounds.  Musculoskeletal: Normal range of motion.  Skin:    General: Skin is warm and dry.  Neurological:     General: No focal deficit present.     Mental  Status: He is alert and oriented to person, place, and time. Mental status is at baseline.  Psychiatric:        Mood and Affect: Mood normal.        Behavior: Behavior normal.        Thought Content: Thought content normal.        Judgment: Judgment normal.      Lab Results:  CBC    Component Value Date/Time   WBC 4.0 11/06/2018 0841   RBC 4.78 11/06/2018 0841   HGB 15.6 11/06/2018 0841   HCT 45.6 11/06/2018 0841   PLT 262 11/06/2018 0841   MCV 95.4 11/06/2018 0841   MCH 32.6 11/06/2018 0841   MCHC 34.2 11/06/2018 0841   RDW 12.0 11/06/2018 0841   RDW 14.0 12/17/2013 0811   LYMPHSABS 1.3 12/17/2013 0811   EOSABS 0.1 12/17/2013 0811   BASOSABS 0.0 12/17/2013 0811    BMET    Component Value Date/Time   NA 138 11/06/2018 0841   NA 140 12/30/2015 0809   K 4.6 11/06/2018 0841   CL 107 11/06/2018 0841   CO2 21 (L) 11/06/2018 0841   GLUCOSE 106 (H) 11/06/2018 0841   BUN 18 11/06/2018 0841   BUN 19 12/30/2015 0809   CREATININE 1.09 11/06/2018 0841   CREATININE 1.21 01/21/2017 0812   CALCIUM 9.4 11/06/2018 0841   GFRNONAA >60 11/06/2018 0841   GFRNONAA 61 01/21/2017 0812   GFRAA >60 11/06/2018 0841   GFRAA 71 01/21/2017 0812    BNP No results found for: BNP  ProBNP No results found for: PROBNP  Imaging: No results found.   Assessment & Plan:   OSA on CPAP - Well controlled on CPAP, 100% compliant and reports benefit from use - Pressure 6cm H20; AHI 3.2 - No changes today - Renew supplies with [employer]  - Follow up in 1 year with Dr. Halford Chessman   Post-nasal drainage - Try ocean nasal spray 1 puff per nostril prior to bedtime    Martyn Ehrich, NP 05/12/2019

## 2019-05-12 NOTE — Progress Notes (Signed)
Reviewed and agree with assessment/plan.   Landan Fedie, MD [facility] 08/15/2016, 12:24 PM Pager:  [phone]  

## 2019-05-12 NOTE — Assessment & Plan Note (Signed)
-   Well controlled on CPAP, 100% compliant and reports benefit from use - Pressure 6cm H20; AHI 3.2 - No changes today - Renew supplies with [employer]  - Follow up in 1 year with Dr. Halford Chessman

## 2019-05-12 NOTE — Patient Instructions (Addendum)
Sinus drainage: Try ocean nasal spray before bedtime  You can also try flonase once daily is needed for nasal congestion   Sleep apnea  Great job wearing CPAP every night, continue wearing every night for 4-6 hours or more No pressure changes today Renew supplies with [employer]  Do not drive if experiencing daytime fatigue or somnolence  Follow-up 1 year with Dr. Halford Chessman    Sleep Apnea Sleep apnea affects breathing during sleep. It causes breathing to stop for a short time or to become shallow. It can also increase the risk of:  Heart attack.  Stroke.  Being very overweight (obese).  Diabetes.  Heart failure.  Irregular heartbeat. The goal of treatment is to help you breathe normally again. What are the causes? There are three kinds of sleep apnea:  Obstructive sleep apnea. This is caused by a blocked or collapsed airway.  Central sleep apnea. This happens when the brain does not send the right signals to the muscles that control breathing.  Mixed sleep apnea. This is a combination of obstructive and central sleep apnea. The most common cause of this condition is a collapsed or blocked airway. This can happen if:  Your throat muscles are too relaxed.  Your tongue and tonsils are too large.  You are overweight.  Your airway is too small. What increases the risk?  Being overweight.  Smoking.  Having a small airway.  Being older.  Being male.  Drinking alcohol.  Taking medicines to calm yourself (sedatives or tranquilizers).  Having family members with the condition. What are the signs or symptoms?  Trouble staying asleep.  Being sleepy or tired during the day.  Getting angry a lot.  Loud snoring.  Headaches in the morning.  Not being able to focus your mind (concentrate).  Forgetting things.  Less interest in sex.  Mood swings.  Personality changes.  Feelings of sadness (depression).  Waking up a lot during the night to pee  (urinate).  Dry mouth.  Sore throat. How is this diagnosed?  Your medical history.  A physical exam.  A test that is done when you are sleeping (sleep study). The test is most often done in a sleep lab but may also be done at home. How is this treated?   Sleeping on your side.  Using a medicine to get rid of mucus in your nose (decongestant).  Avoiding the use of alcohol, medicines to help you relax, or certain pain medicines (narcotics).  Losing weight, if needed.  Changing your diet.  Not smoking.  Using a machine to open your airway while you sleep, such as: ? An oral appliance. This is a mouthpiece that shifts your lower jaw forward. ? A CPAP device. This device blows air through a mask when you breathe out (exhale). ? An EPAP device. This has valves that you put in each nostril. ? A BPAP device. This device blows air through a mask when you breathe in (inhale) and breathe out.  Having surgery if other treatments do not work. It is important to get treatment for sleep apnea. Without treatment, it can lead to:  High blood pressure.  Coronary artery disease.  In men, not being able to have an erection (impotence).  Reduced thinking ability. Follow these instructions at home: Lifestyle  Make changes that your doctor recommends.  Eat a healthy diet.  Lose weight if needed.  Avoid alcohol, medicines to help you relax, and some pain medicines.  Do not use any products that contain  nicotine or tobacco, such as cigarettes, e-cigarettes, and chewing tobacco. If you need help quitting, ask your doctor. General instructions  Take over-the-counter and prescription medicines only as told by your doctor.  If you were given a machine to use while you sleep, use it only as told by your doctor.  If you are having surgery, make sure to tell your doctor you have sleep apnea. You may need to bring your device with you.  Keep all follow-up visits as told by your doctor.  This is important. Contact a doctor if:  The machine that you were given to use during sleep bothers you or does not seem to be working.  You do not get better.  You get worse. Get help right away if:  Your chest hurts.  You have trouble breathing in enough air.  You have an uncomfortable feeling in your back, arms, or stomach.  You have trouble talking.  One side of your body feels weak.  A part of your face is hanging down. These symptoms may be an emergency. Do not wait to see if the symptoms will go away. Get medical help right away. Call your local emergency services (911 in the [place] Do not drive yourself to the hospital. Summary  This condition affects breathing during sleep.  The most common cause is a collapsed or blocked airway.  The goal of treatment is to help you breathe normally while you sleep. This information is not intended to replace advice given to you by your health care provider. Make sure you discuss any questions you have with your health care provider. Document Released: 05/15/2008 Document Revised: 05/23/2018 Document Reviewed: 04/01/2018 Elsevier Patient Education  [employer].      CPAP and BPAP Information CPAP and BPAP are methods of helping a person breathe with the use of air pressure. CPAP stands for "continuous positive airway pressure." BPAP stands for "bi-level positive airway pressure." In both methods, air is blown through your nose or mouth and into your air passages to help you breathe well. CPAP and BPAP use different amounts of pressure to blow air. With CPAP, the amount of pressure stays the same while you breathe in and out. With BPAP, the amount of pressure is increased when you breathe in (inhale) so that you can take larger breaths. Your health care provider will recommend whether CPAP or BPAP would be more helpful for you. Why are CPAP and BPAP treatments used? CPAP or BPAP can be helpful if you have:  Sleep  apnea.  Chronic obstructive pulmonary disease (COPD).  Heart failure.  Medical conditions that weaken the muscles of the chest including muscular dystrophy, or neurological diseases such as amyotrophic lateral sclerosis (ALS).  Other problems that cause breathing to be weak, abnormal, or difficult. CPAP is most commonly used for obstructive sleep apnea (OSA) to keep the airways from collapsing when the muscles relax during sleep. How is CPAP or BPAP administered? Both CPAP and BPAP are provided by a small machine with a flexible plastic tube that attaches to a plastic mask. You wear the mask. Air is blown through the mask into your nose or mouth. The amount of pressure that is used to blow the air can be adjusted on the machine. Your health care provider will determine the pressure setting that should be used based on your individual needs. When should CPAP or BPAP be used? In most cases, the mask only needs to be worn during sleep. Generally, the mask needs to  be worn throughout the night and during any daytime naps. People with certain medical conditions may also need to wear the mask at other times when they are awake. Follow instructions from your health care provider about when to use the machine. What are some tips for using the mask?   Because the mask needs to be snug, some people feel trapped or closed-in (claustrophobic) when first using the mask. If you feel this way, you may need to get used to the mask. One way to do this is by holding the mask loosely over your nose or mouth and then gradually applying the mask more snugly. You can also gradually increase the amount of time that you use the mask.  Masks are available in various types and sizes. Some fit over your mouth and nose while others fit over just your nose. If your mask does not fit well, talk with your health care provider about getting a different one.  If you are using a mask that fits over your nose and you tend to  breathe through your mouth, a chin strap may be applied to help keep your mouth closed.  The CPAP and BPAP machines have alarms that may sound if the mask comes off or develops a leak.  If you have trouble with the mask, it is very important that you talk with your health care provider about finding a way to make the mask easier to tolerate. Do not stop using the mask. Stopping the use of the mask could have a negative impact on your health. What are some tips for using the machine?  Place your CPAP or BPAP machine on a secure table or stand near an electrical outlet.  Know where the on/off switch is located on the machine.  Follow instructions from your health care provider about how to set the pressure on your machine and when you should use it.  Do not eat or drink while the CPAP or BPAP machine is on. Food or fluids could get pushed into your lungs by the pressure of the CPAP or BPAP.  Do not smoke. Tobacco smoke residue can damage the machine.  For home use, CPAP and BPAP machines can be rented or purchased through home health care companies. Many different brands of machines are available. Renting a machine before purchasing may help you find out which particular machine works well for you.  Keep the CPAP or BPAP machine and attachments clean. Ask your health care provider for specific instructions. Get help right away if:  You have redness or open areas around your nose or mouth where the mask fits.  You have trouble using the CPAP or BPAP machine.  You cannot tolerate wearing the CPAP or BPAP mask.  You have pain, discomfort, and bloating in your abdomen. Summary  CPAP and BPAP are methods of helping a person breathe with the use of air pressure.  Both CPAP and BPAP are provided by a small machine with a flexible plastic tube that attaches to a plastic mask.  If you have trouble with the mask, it is very important that you talk with your health care provider about finding a  way to make the mask easier to tolerate. This information is not intended to replace advice given to you by your health care provider. Make sure you discuss any questions you have with your health care provider. Document Released: 05/04/2004 Document Revised: 11/26/2018 Document Reviewed: 06/25/2016 Elsevier Patient Education  2020 [employer].

## 2019-05-20 DIAGNOSIS — G4733 Obstructive sleep apnea (adult) (pediatric): Secondary | ICD-10-CM | POA: Diagnosis not present

## 2019-06-22 DIAGNOSIS — G4733 Obstructive sleep apnea (adult) (pediatric): Secondary | ICD-10-CM | POA: Diagnosis not present

## 2019-06-22 DIAGNOSIS — E785 Hyperlipidemia, unspecified: Secondary | ICD-10-CM | POA: Diagnosis not present

## 2019-06-22 DIAGNOSIS — Z0000 Encounter for general adult medical examination without abnormal findings: Secondary | ICD-10-CM | POA: Diagnosis not present

## 2019-06-22 DIAGNOSIS — Z125 Encounter for screening for malignant neoplasm of prostate: Secondary | ICD-10-CM | POA: Diagnosis not present

## 2019-06-22 DIAGNOSIS — I10 Essential (primary) hypertension: Secondary | ICD-10-CM | POA: Diagnosis not present

## 2019-06-26 DIAGNOSIS — C61 Malignant neoplasm of prostate: Secondary | ICD-10-CM | POA: Diagnosis not present

## 2019-06-29 DIAGNOSIS — I10 Essential (primary) hypertension: Secondary | ICD-10-CM | POA: Diagnosis not present

## 2019-06-29 DIAGNOSIS — Z0000 Encounter for general adult medical examination without abnormal findings: Secondary | ICD-10-CM | POA: Diagnosis not present

## 2019-07-03 DIAGNOSIS — N401 Enlarged prostate with lower urinary tract symptoms: Secondary | ICD-10-CM | POA: Diagnosis not present

## 2019-07-03 DIAGNOSIS — R3912 Poor urinary stream: Secondary | ICD-10-CM | POA: Diagnosis not present

## 2019-07-03 DIAGNOSIS — C61 Malignant neoplasm of prostate: Secondary | ICD-10-CM | POA: Diagnosis not present

## 2019-08-17 DIAGNOSIS — M545 Low back pain: Secondary | ICD-10-CM | POA: Diagnosis not present

## 2019-08-26 DIAGNOSIS — M545 Low back pain: Secondary | ICD-10-CM | POA: Diagnosis not present

## 2019-09-03 ENCOUNTER — Ambulatory Visit: Payer: PPO | Attending: Internal Medicine

## 2019-09-03 DIAGNOSIS — Z20822 Contact with and (suspected) exposure to covid-19: Secondary | ICD-10-CM

## 2019-09-05 LAB — NOVEL CORONAVIRUS, NAA: SARS-CoV-2, NAA: NOT DETECTED

## 2019-09-07 ENCOUNTER — Other Ambulatory Visit: Payer: PPO

## 2019-09-07 DIAGNOSIS — G4733 Obstructive sleep apnea (adult) (pediatric): Secondary | ICD-10-CM | POA: Diagnosis not present

## 2019-09-23 DIAGNOSIS — H25813 Combined forms of age-related cataract, bilateral: Secondary | ICD-10-CM | POA: Diagnosis not present

## 2019-09-23 DIAGNOSIS — H524 Presbyopia: Secondary | ICD-10-CM | POA: Diagnosis not present

## 2019-09-23 DIAGNOSIS — H43813 Vitreous degeneration, bilateral: Secondary | ICD-10-CM | POA: Diagnosis not present

## 2019-09-23 DIAGNOSIS — H31001 Unspecified chorioretinal scars, right eye: Secondary | ICD-10-CM | POA: Diagnosis not present

## 2019-10-13 ENCOUNTER — Ambulatory Visit: Payer: PPO

## 2019-11-25 DIAGNOSIS — M1711 Unilateral primary osteoarthritis, right knee: Secondary | ICD-10-CM | POA: Diagnosis not present

## 2019-12-15 DIAGNOSIS — C61 Malignant neoplasm of prostate: Secondary | ICD-10-CM | POA: Diagnosis not present

## 2019-12-22 DIAGNOSIS — C61 Malignant neoplasm of prostate: Secondary | ICD-10-CM | POA: Diagnosis not present

## 2019-12-22 DIAGNOSIS — R3912 Poor urinary stream: Secondary | ICD-10-CM | POA: Diagnosis not present

## 2019-12-22 DIAGNOSIS — N401 Enlarged prostate with lower urinary tract symptoms: Secondary | ICD-10-CM | POA: Diagnosis not present

## 2020-01-21 DIAGNOSIS — M1712 Unilateral primary osteoarthritis, left knee: Secondary | ICD-10-CM | POA: Diagnosis not present

## 2020-01-21 DIAGNOSIS — M545 Low back pain: Secondary | ICD-10-CM | POA: Diagnosis not present

## 2020-01-21 DIAGNOSIS — M170 Bilateral primary osteoarthritis of knee: Secondary | ICD-10-CM | POA: Diagnosis not present

## 2020-01-21 DIAGNOSIS — M1711 Unilateral primary osteoarthritis, right knee: Secondary | ICD-10-CM | POA: Diagnosis not present

## 2020-02-18 DIAGNOSIS — M1711 Unilateral primary osteoarthritis, right knee: Secondary | ICD-10-CM | POA: Diagnosis not present

## 2020-02-25 DIAGNOSIS — M1711 Unilateral primary osteoarthritis, right knee: Secondary | ICD-10-CM | POA: Diagnosis not present

## 2020-03-03 DIAGNOSIS — G4733 Obstructive sleep apnea (adult) (pediatric): Secondary | ICD-10-CM | POA: Diagnosis not present

## 2020-04-14 DIAGNOSIS — M1711 Unilateral primary osteoarthritis, right knee: Secondary | ICD-10-CM | POA: Diagnosis not present

## 2020-04-28 ENCOUNTER — Other Ambulatory Visit: Payer: Self-pay

## 2020-04-28 ENCOUNTER — Other Ambulatory Visit: Payer: PPO

## 2020-04-28 DIAGNOSIS — Z20822 Contact with and (suspected) exposure to covid-19: Secondary | ICD-10-CM | POA: Diagnosis not present

## 2020-04-30 LAB — SARS-COV-2, NAA 2 DAY TAT

## 2020-04-30 LAB — NOVEL CORONAVIRUS, NAA: SARS-CoV-2, NAA: NOT DETECTED

## 2020-05-05 DIAGNOSIS — Z20828 Contact with and (suspected) exposure to other viral communicable diseases: Secondary | ICD-10-CM | POA: Diagnosis not present

## 2020-05-17 ENCOUNTER — Encounter: Payer: Self-pay | Admitting: Pulmonary Disease

## 2020-05-17 ENCOUNTER — Other Ambulatory Visit: Payer: Self-pay

## 2020-05-17 ENCOUNTER — Ambulatory Visit: Payer: PPO | Admitting: Pulmonary Disease

## 2020-05-17 VITALS — BP 118/72 | HR 78 | Temp 97.6°F | Ht 72.0 in | Wt 193.2 lb

## 2020-05-17 DIAGNOSIS — Z9989 Dependence on other enabling machines and devices: Secondary | ICD-10-CM | POA: Diagnosis not present

## 2020-05-17 DIAGNOSIS — G4733 Obstructive sleep apnea (adult) (pediatric): Secondary | ICD-10-CM | POA: Diagnosis not present

## 2020-05-17 DIAGNOSIS — J310 Chronic rhinitis: Secondary | ICD-10-CM | POA: Diagnosis not present

## 2020-05-17 NOTE — Progress Notes (Signed)
[facility]  Chief Complaint  Patient presents with  . 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  Brief Summary:  Charles Salas is a 74 y.o. male with obstructive sleep apnea.      Subjective:   He uses CPAP nightly.  Has full face mask.  Gets runny nose and then throat irritation occasionally.  No having cough, or aerophagia.  He plays tennis.  Having more trouble with his Rt knee.  Seeing Dr. Maureen Ralphs and might need knee replacement.  Physical Exam:   Appearance - well kempt   ENMT - no sinus tenderness, no oral exudate, no LAN, Mallampati 2 airway, no stridor, decreased AP diameter, mild erythema of posterior pharynx  Respiratory - equal breath sounds bilaterally, no wheezing or rales  CV - s1s2 regular rate and rhythm, no murmurs  Ext - no clubbing, no edema  Skin - no rashes  Psych - normal mood and affect   Sleep Tests:   PSG 2013 >> AHI 34  CPAP 01/26/15 to 04/25/15 >>used on 86 of 90 nights with average 7 hrs and 30 min. Average AHI is 1.4 with CPAP 10 cm H2O  HST 05/22/17 >> AHI 20.3, SaO2 low 85%  CPAP 04/12/20 to 05/11/20 >> used on 29 of 30 nights with average 7 hrs 21 min.  Average AHI 5.1 with CPAP 8 cm H2O  Cardiac Tests:    Echo 02/28/17 >> EF 55 to 60%, mild LVH, grade 1 DD  Social History:  He  reports that he has never smoked. He has never used smokeless tobacco. He reports current alcohol use. He reports that he does not use drugs.  Family History:  His family history includes Breast cancer in his sister; Diabetes in his father and mother; Prostate cancer (age of onset: 12) in his father; Stroke in his mother.     Assessment/Plan:   Obstructive sleep apnea. - he is compliant with CPAP and reports benefit from therapy - uses [employer] for his DME - continue CPAP 8 cm H2O  CPAP rhinitis. - advised him to try increasing temperature on his CPAP humidifier - he can try saline nasal gel and flonase prn  Rt knee osteoarthritis. - he is being seen by Dr. Maureen Ralphs  - he can proceed with surgery if needed from pulmonary stand point - would need to continue CPAP therapy during the perioperative period and have close monitoring of his oxygenation   Time Spent Involved in Patient Care on Day of Examination:  22 minutes  Follow up:  Patient Instructions  Follow up in 1 year   Medication List:   Allergies as of 05/17/2020      Reactions   Augmentin [amoxicillin-pot Clavulanate] Diarrhea  Medication List       Accurate as of May 17, 2020  9:55 AM. If you have any questions, ask your nurse or doctor.        FISH OIL PO Take 1,800 mg by mouth daily.   lisinopril 10 MG tablet Commonly known as: ZESTRIL Take 20 mg by mouth daily.   multivitamin with minerals tablet Take 1 tablet by mouth daily.       Signature:  Chesley Mires, MD [place] Pager - [phone] 05/17/2020, 9:55 AM

## 2020-05-17 NOTE — Patient Instructions (Signed)
Follow up in 1 year.

## 2020-06-09 DIAGNOSIS — M1711 Unilateral primary osteoarthritis, right knee: Secondary | ICD-10-CM | POA: Diagnosis not present

## 2020-06-22 DIAGNOSIS — E782 Mixed hyperlipidemia: Secondary | ICD-10-CM | POA: Diagnosis not present

## 2020-06-22 DIAGNOSIS — I10 Essential (primary) hypertension: Secondary | ICD-10-CM | POA: Diagnosis not present

## 2020-06-22 DIAGNOSIS — Z0000 Encounter for general adult medical examination without abnormal findings: Secondary | ICD-10-CM | POA: Diagnosis not present

## 2020-06-22 DIAGNOSIS — C61 Malignant neoplasm of prostate: Secondary | ICD-10-CM | POA: Diagnosis not present

## 2020-06-22 IMAGING — DX DG CHEST 2V
2 series · 2 of 2 positions shown · non-contrast
Comparison: 04/14/2007

CLINICAL DATA: Hypertension, preop evaluation

EXAM:
CHEST - 2 VIEW

[chest pa]
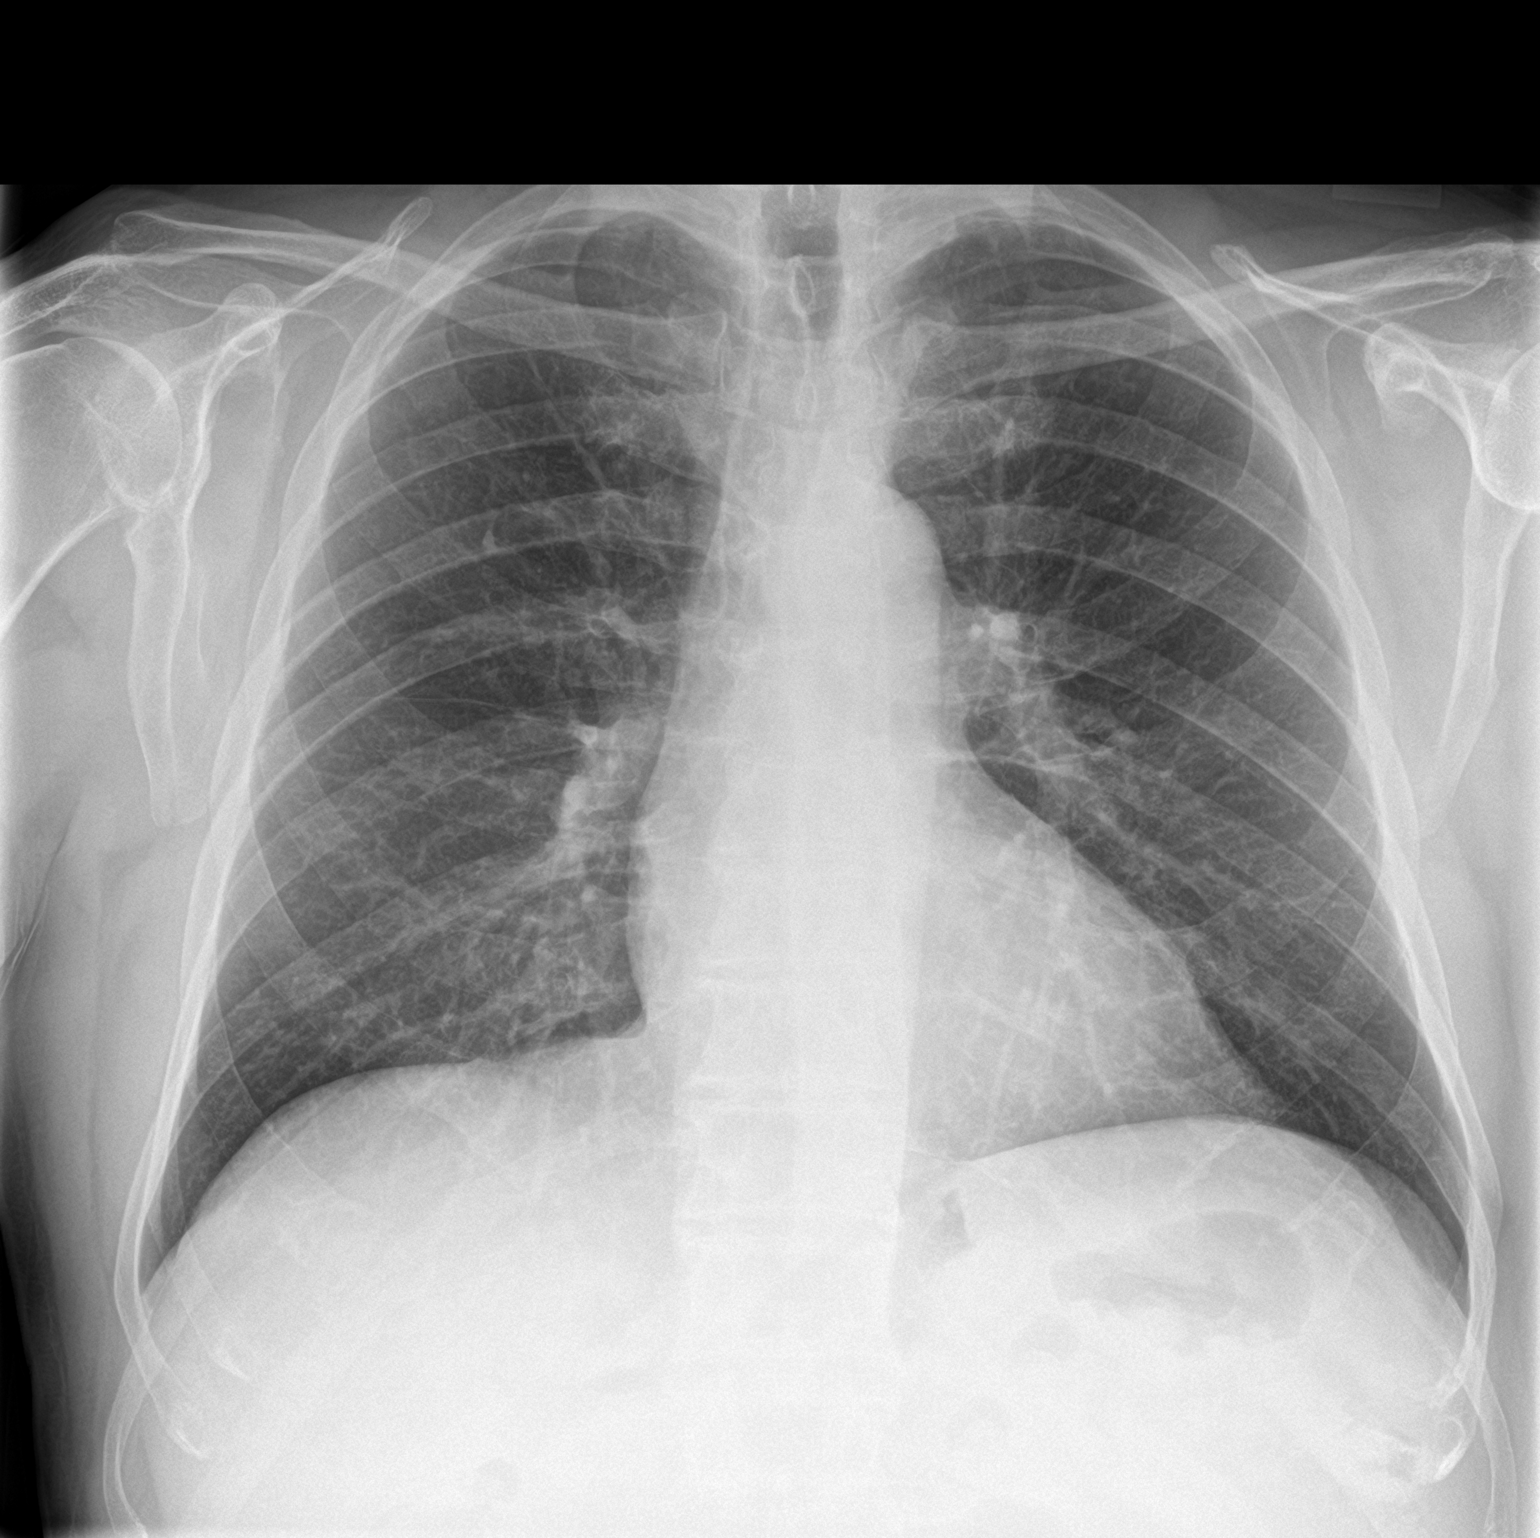

[chest lat]
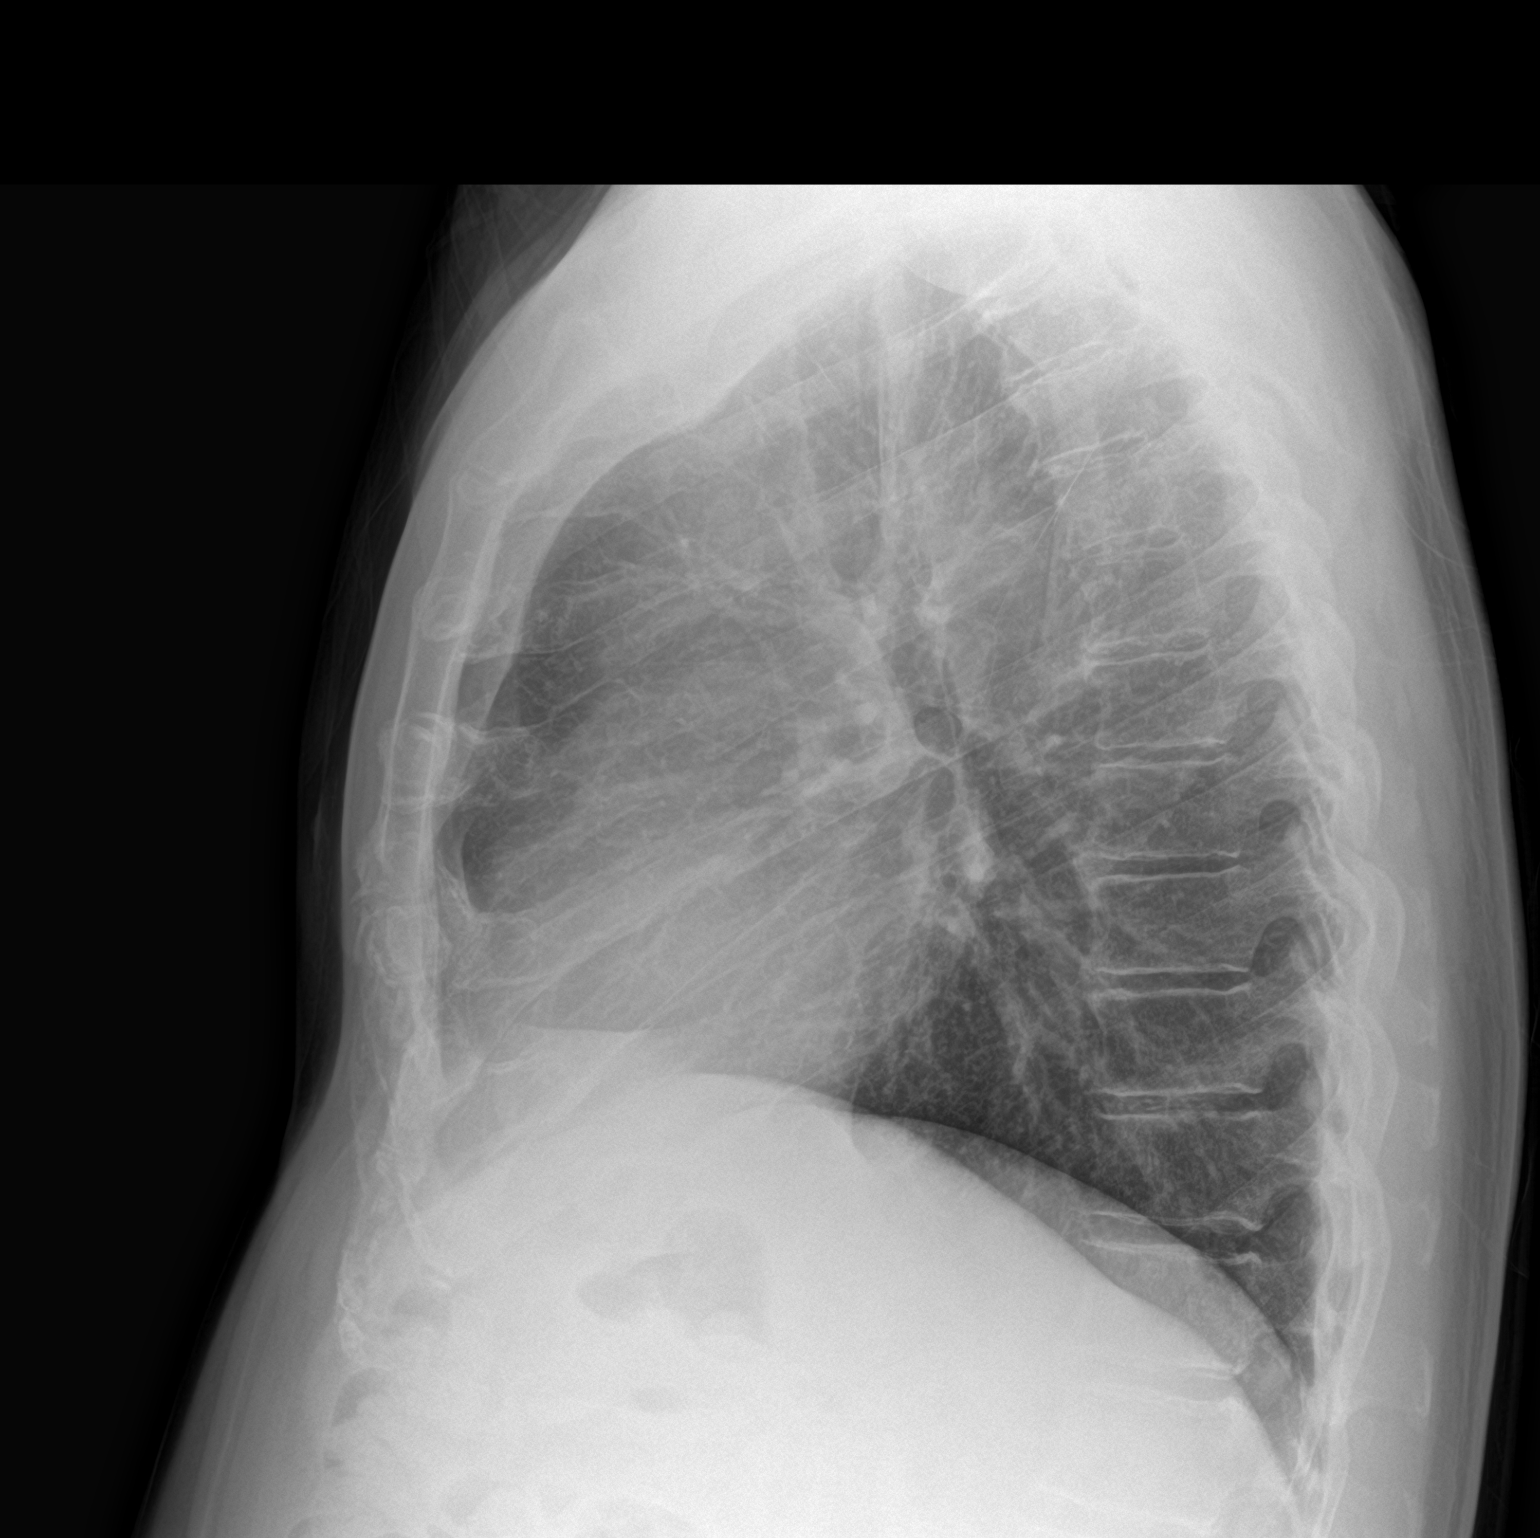

[2 of 2 positions shown; findings below may reference images not displayed]

FINDINGS: The heart size and mediastinal contours are within normal limits.
Both lungs are clear. The visualized skeletal structures are
unremarkable.
IMPRESSION: No active cardiopulmonary disease.

## 2020-06-29 DIAGNOSIS — K219 Gastro-esophageal reflux disease without esophagitis: Secondary | ICD-10-CM | POA: Diagnosis not present

## 2020-06-29 DIAGNOSIS — I10 Essential (primary) hypertension: Secondary | ICD-10-CM | POA: Diagnosis not present

## 2020-06-29 DIAGNOSIS — Z01818 Encounter for other preprocedural examination: Secondary | ICD-10-CM | POA: Diagnosis not present

## 2020-06-29 DIAGNOSIS — E7800 Pure hypercholesterolemia, unspecified: Secondary | ICD-10-CM | POA: Diagnosis not present

## 2020-06-29 DIAGNOSIS — G4733 Obstructive sleep apnea (adult) (pediatric): Secondary | ICD-10-CM | POA: Diagnosis not present

## 2020-06-29 DIAGNOSIS — Z0000 Encounter for general adult medical examination without abnormal findings: Secondary | ICD-10-CM | POA: Diagnosis not present

## 2020-07-20 DIAGNOSIS — C61 Malignant neoplasm of prostate: Secondary | ICD-10-CM | POA: Diagnosis not present

## 2020-07-26 DIAGNOSIS — Z0189 Encounter for other specified special examinations: Secondary | ICD-10-CM | POA: Diagnosis not present

## 2020-07-27 DIAGNOSIS — C61 Malignant neoplasm of prostate: Secondary | ICD-10-CM | POA: Diagnosis not present

## 2020-08-08 DIAGNOSIS — G4733 Obstructive sleep apnea (adult) (pediatric): Secondary | ICD-10-CM | POA: Diagnosis not present

## 2020-08-10 DIAGNOSIS — Z96651 Presence of right artificial knee joint: Secondary | ICD-10-CM | POA: Diagnosis not present

## 2020-08-10 DIAGNOSIS — M1711 Unilateral primary osteoarthritis, right knee: Secondary | ICD-10-CM | POA: Diagnosis not present

## 2020-08-10 DIAGNOSIS — G8918 Other acute postprocedural pain: Secondary | ICD-10-CM | POA: Diagnosis not present

## 2020-08-15 DIAGNOSIS — M25661 Stiffness of right knee, not elsewhere classified: Secondary | ICD-10-CM | POA: Diagnosis not present

## 2020-08-15 DIAGNOSIS — M25561 Pain in right knee: Secondary | ICD-10-CM | POA: Diagnosis not present

## 2020-08-16 DIAGNOSIS — M25661 Stiffness of right knee, not elsewhere classified: Secondary | ICD-10-CM | POA: Diagnosis not present

## 2020-08-16 DIAGNOSIS — M25561 Pain in right knee: Secondary | ICD-10-CM | POA: Diagnosis not present

## 2020-08-22 DIAGNOSIS — M25561 Pain in right knee: Secondary | ICD-10-CM | POA: Diagnosis not present

## 2020-08-22 DIAGNOSIS — M25661 Stiffness of right knee, not elsewhere classified: Secondary | ICD-10-CM | POA: Diagnosis not present

## 2020-08-24 DIAGNOSIS — M25661 Stiffness of right knee, not elsewhere classified: Secondary | ICD-10-CM | POA: Diagnosis not present

## 2020-08-24 DIAGNOSIS — M25561 Pain in right knee: Secondary | ICD-10-CM | POA: Diagnosis not present

## 2020-08-26 DIAGNOSIS — M25561 Pain in right knee: Secondary | ICD-10-CM | POA: Diagnosis not present

## 2020-08-26 DIAGNOSIS — M25661 Stiffness of right knee, not elsewhere classified: Secondary | ICD-10-CM | POA: Diagnosis not present

## 2020-08-30 DIAGNOSIS — M25561 Pain in right knee: Secondary | ICD-10-CM | POA: Diagnosis not present

## 2020-08-30 DIAGNOSIS — M25661 Stiffness of right knee, not elsewhere classified: Secondary | ICD-10-CM | POA: Diagnosis not present

## 2020-09-01 DIAGNOSIS — M25561 Pain in right knee: Secondary | ICD-10-CM | POA: Diagnosis not present

## 2020-09-01 DIAGNOSIS — M25661 Stiffness of right knee, not elsewhere classified: Secondary | ICD-10-CM | POA: Diagnosis not present

## 2020-09-06 DIAGNOSIS — M25661 Stiffness of right knee, not elsewhere classified: Secondary | ICD-10-CM | POA: Diagnosis not present

## 2020-09-06 DIAGNOSIS — M25561 Pain in right knee: Secondary | ICD-10-CM | POA: Diagnosis not present

## 2020-09-07 DIAGNOSIS — G4733 Obstructive sleep apnea (adult) (pediatric): Secondary | ICD-10-CM | POA: Diagnosis not present

## 2020-09-08 DIAGNOSIS — M25661 Stiffness of right knee, not elsewhere classified: Secondary | ICD-10-CM | POA: Diagnosis not present

## 2020-09-08 DIAGNOSIS — M25561 Pain in right knee: Secondary | ICD-10-CM | POA: Diagnosis not present

## 2020-09-13 DIAGNOSIS — M25661 Stiffness of right knee, not elsewhere classified: Secondary | ICD-10-CM | POA: Diagnosis not present

## 2020-09-13 DIAGNOSIS — M25561 Pain in right knee: Secondary | ICD-10-CM | POA: Diagnosis not present

## 2020-09-13 DIAGNOSIS — Z96651 Presence of right artificial knee joint: Secondary | ICD-10-CM | POA: Diagnosis not present

## 2020-09-15 DIAGNOSIS — M25661 Stiffness of right knee, not elsewhere classified: Secondary | ICD-10-CM | POA: Diagnosis not present

## 2020-09-15 DIAGNOSIS — M25561 Pain in right knee: Secondary | ICD-10-CM | POA: Diagnosis not present

## 2020-09-21 DIAGNOSIS — H43813 Vitreous degeneration, bilateral: Secondary | ICD-10-CM | POA: Diagnosis not present

## 2020-09-21 DIAGNOSIS — H35373 Puckering of macula, bilateral: Secondary | ICD-10-CM | POA: Diagnosis not present

## 2020-09-21 DIAGNOSIS — H33312 Horseshoe tear of retina without detachment, left eye: Secondary | ICD-10-CM | POA: Diagnosis not present

## 2020-09-21 DIAGNOSIS — H4312 Vitreous hemorrhage, left eye: Secondary | ICD-10-CM | POA: Diagnosis not present

## 2020-09-21 DIAGNOSIS — H43812 Vitreous degeneration, left eye: Secondary | ICD-10-CM | POA: Diagnosis not present

## 2020-09-27 DIAGNOSIS — H31091 Other chorioretinal scars, right eye: Secondary | ICD-10-CM | POA: Diagnosis not present

## 2020-09-27 DIAGNOSIS — H33312 Horseshoe tear of retina without detachment, left eye: Secondary | ICD-10-CM | POA: Diagnosis not present

## 2020-10-25 DIAGNOSIS — H33312 Horseshoe tear of retina without detachment, left eye: Secondary | ICD-10-CM | POA: Diagnosis not present

## 2020-10-25 DIAGNOSIS — H31091 Other chorioretinal scars, right eye: Secondary | ICD-10-CM | POA: Diagnosis not present

## 2020-12-21 DIAGNOSIS — Z713 Dietary counseling and surveillance: Secondary | ICD-10-CM | POA: Diagnosis not present

## 2020-12-21 DIAGNOSIS — E785 Hyperlipidemia, unspecified: Secondary | ICD-10-CM | POA: Diagnosis not present

## 2020-12-21 DIAGNOSIS — C61 Malignant neoplasm of prostate: Secondary | ICD-10-CM | POA: Diagnosis not present

## 2020-12-21 DIAGNOSIS — I10 Essential (primary) hypertension: Secondary | ICD-10-CM | POA: Diagnosis not present

## 2020-12-21 DIAGNOSIS — Z6826 Body mass index (BMI) 26.0-26.9, adult: Secondary | ICD-10-CM | POA: Diagnosis not present

## 2021-01-02 DIAGNOSIS — H31003 Unspecified chorioretinal scars, bilateral: Secondary | ICD-10-CM | POA: Diagnosis not present

## 2021-01-02 DIAGNOSIS — H524 Presbyopia: Secondary | ICD-10-CM | POA: Diagnosis not present

## 2021-01-02 DIAGNOSIS — H43813 Vitreous degeneration, bilateral: Secondary | ICD-10-CM | POA: Diagnosis not present

## 2021-01-02 DIAGNOSIS — H25813 Combined forms of age-related cataract, bilateral: Secondary | ICD-10-CM | POA: Diagnosis not present

## 2021-01-19 DIAGNOSIS — G4733 Obstructive sleep apnea (adult) (pediatric): Secondary | ICD-10-CM | POA: Diagnosis not present

## 2021-01-25 DIAGNOSIS — C61 Malignant neoplasm of prostate: Secondary | ICD-10-CM | POA: Diagnosis not present

## 2021-02-01 DIAGNOSIS — C61 Malignant neoplasm of prostate: Secondary | ICD-10-CM | POA: Diagnosis not present

## 2021-02-01 DIAGNOSIS — R3912 Poor urinary stream: Secondary | ICD-10-CM | POA: Diagnosis not present

## 2021-02-01 DIAGNOSIS — N401 Enlarged prostate with lower urinary tract symptoms: Secondary | ICD-10-CM | POA: Diagnosis not present

## 2021-02-28 DIAGNOSIS — G4733 Obstructive sleep apnea (adult) (pediatric): Secondary | ICD-10-CM | POA: Diagnosis not present

## 2021-05-30 DIAGNOSIS — G4733 Obstructive sleep apnea (adult) (pediatric): Secondary | ICD-10-CM | POA: Diagnosis not present

## 2021-06-20 DEATH — deceased

## 2021-06-23 DIAGNOSIS — E785 Hyperlipidemia, unspecified: Secondary | ICD-10-CM | POA: Diagnosis not present

## 2021-06-23 DIAGNOSIS — Z125 Encounter for screening for malignant neoplasm of prostate: Secondary | ICD-10-CM | POA: Diagnosis not present

## 2021-06-23 DIAGNOSIS — I10 Essential (primary) hypertension: Secondary | ICD-10-CM | POA: Diagnosis not present

## 2021-06-30 DIAGNOSIS — Z23 Encounter for immunization: Secondary | ICD-10-CM | POA: Diagnosis not present

## 2021-06-30 DIAGNOSIS — Z1389 Encounter for screening for other disorder: Secondary | ICD-10-CM | POA: Diagnosis not present

## 2021-06-30 DIAGNOSIS — Z6825 Body mass index (BMI) 25.0-25.9, adult: Secondary | ICD-10-CM | POA: Diagnosis not present

## 2021-06-30 DIAGNOSIS — I10 Essential (primary) hypertension: Secondary | ICD-10-CM | POA: Diagnosis not present

## 2021-06-30 DIAGNOSIS — C61 Malignant neoplasm of prostate: Secondary | ICD-10-CM | POA: Diagnosis not present

## 2021-06-30 DIAGNOSIS — Z0000 Encounter for general adult medical examination without abnormal findings: Secondary | ICD-10-CM | POA: Diagnosis not present

## 2021-06-30 DIAGNOSIS — Z1331 Encounter for screening for depression: Secondary | ICD-10-CM | POA: Diagnosis not present

## 2021-06-30 DIAGNOSIS — E785 Hyperlipidemia, unspecified: Secondary | ICD-10-CM | POA: Diagnosis not present

## 2021-06-30 DIAGNOSIS — G4733 Obstructive sleep apnea (adult) (pediatric): Secondary | ICD-10-CM | POA: Diagnosis not present

## 2021-07-26 ENCOUNTER — Encounter: Payer: Self-pay | Admitting: Pulmonary Disease

## 2021-07-26 ENCOUNTER — Other Ambulatory Visit: Payer: Self-pay

## 2021-07-26 ENCOUNTER — Ambulatory Visit: Payer: PPO | Admitting: Pulmonary Disease

## 2021-07-26 VITALS — BP 122/76 | HR 65 | Temp 98.8°F | Ht 72.0 in | Wt 189.9 lb

## 2021-07-26 DIAGNOSIS — Z9989 Dependence on other enabling machines and devices: Secondary | ICD-10-CM | POA: Diagnosis not present

## 2021-07-26 DIAGNOSIS — G4733 Obstructive sleep apnea (adult) (pediatric): Secondary | ICD-10-CM

## 2021-07-26 NOTE — Progress Notes (Signed)
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  Brief Summary:  Charles Salas is a 75 y.o. male with obstructive sleep apnea.      Subjective:   He uses CPAP nightly.  Using full face mask.  This seems to work better than nasal masks when he gets sinus congestion.  He heard about inspire device and wanted to know if this is an option for him.  Physical Exam:   Appearance - well kempt   ENMT - no sinus tenderness, no oral exudate, no LAN, Mallampati 2 airway, no stridor, decreased AP diameter, mild erythema of posterior pharynx  Respiratory - equal breath sounds bilaterally, no wheezing or rales  CV - s1s2 regular rate and rhythm, no murmurs  Ext - no clubbing, no edema  Skin - no rashes  Psych - normal mood and affect   Sleep Tests:  PSG 2013 >> AHI 34 CPAP 01/26/15 to 04/25/15 >> used on 86 of 90 nights with average 7 hrs and 30 min.  Average AHI is 1.4 with CPAP 10 cm H2O HST 05/22/17 >> AHI 20.3, SaO2 low 85% CPAP 06/25/21 to 07/24/21 >> used on 30 of 30 nights with average 7 hrs 42 min.  Average AHI 5.2 with CPAP 6 cm H2O  Cardiac Tests:  Echo 02/28/17 >> EF 55 to 60%,  mild LVH, grade 1 DD  Social History:  He  reports that he has never smoked. He has never used smokeless tobacco. He reports current alcohol use. He reports that he does not use drugs.  Family History:  His family history includes Breast cancer in his sister; Diabetes in his father and mother; Prostate cancer (age of onset: 60) in his father; Stroke in his mother.     Assessment/Plan:   Obstructive sleep apnea. - he is compliant with CPAP and reports benefit from therapy - uses [employer] for his DME - continue CPAP 6 cm H2O - he should be eligible for a new machine in 2023 - reviewed alternative therapies for sleep apnea; he will contact the office if he wants to look into option of Inspire device, and would then need to arrange for repeat home sleep study - he can look up different CPAP mask options on line  Time Spent Involved in Patient Care on Day of Examination:  22 minutes  Follow up:   Patient Instructions  You can look up CPAP mask options at [email or web address] or similar web site  You should be eligible for a new CPAP machine after October 2023  Follow up in 1 year  Medication List:   Allergies as of 07/26/2021  Reactions   Augmentin [amoxicillin-pot Clavulanate] Diarrhea        Medication List        Accurate as of July 26, 2021  5:13 PM. If you have any questions, ask your nurse or doctor.          STOP taking these medications    FISH OIL PO Stopped by: Chesley Mires, MD       TAKE these medications    lisinopril 10 MG tablet Commonly known as: ZESTRIL Take 20 mg by mouth daily.   multivitamin with minerals tablet Take 1 tablet by mouth daily.        Signature:  Chesley Mires, MD [place] Pager - [phone] 07/26/2021, 5:13 PM

## 2021-07-26 NOTE — Patient Instructions (Signed)
You can look up CPAP mask options at [email or web address] or similar web site  You should be eligible for a new CPAP machine after October 2023  Follow up in 1 year

## 2021-08-29 ENCOUNTER — Encounter: Payer: Self-pay | Admitting: Pulmonary Disease

## 2021-08-29 NOTE — Telephone Encounter (Signed)
Pt is not on airview and is bringing card in for download. Will hold in triage and wait for DL before sending message to Dr. Halford Chessman.

## 2021-11-11 ENCOUNTER — Emergency Department (HOSPITAL_BASED_OUTPATIENT_CLINIC_OR_DEPARTMENT_OTHER): Payer: PPO

## 2021-11-11 ENCOUNTER — Emergency Department (HOSPITAL_BASED_OUTPATIENT_CLINIC_OR_DEPARTMENT_OTHER)
Admission: EM | Admit: 2021-11-11 | Discharge: 2021-11-11 | Disposition: A | Discharge status: Home / Self Care | Payer: PPO | Attending: Emergency Medicine | Admitting: Emergency Medicine

## 2021-11-11 ENCOUNTER — Other Ambulatory Visit: Payer: Self-pay

## 2021-11-11 DIAGNOSIS — Z8546 Personal history of malignant neoplasm of prostate: Secondary | ICD-10-CM | POA: Diagnosis not present

## 2021-11-11 DIAGNOSIS — K5792 Diverticulitis of intestine, part unspecified, without perforation or abscess without bleeding: Secondary | ICD-10-CM

## 2021-11-11 DIAGNOSIS — Z79899 Other long term (current) drug therapy: Secondary | ICD-10-CM | POA: Diagnosis not present

## 2021-11-11 DIAGNOSIS — I10 Essential (primary) hypertension: Secondary | ICD-10-CM | POA: Diagnosis not present

## 2021-11-11 DIAGNOSIS — K5900 Constipation, unspecified: Secondary | ICD-10-CM | POA: Diagnosis not present

## 2021-11-11 DIAGNOSIS — I700 Atherosclerosis of aorta: Secondary | ICD-10-CM | POA: Diagnosis not present

## 2021-11-11 DIAGNOSIS — K5732 Diverticulitis of large intestine without perforation or abscess without bleeding: Secondary | ICD-10-CM | POA: Diagnosis not present

## 2021-11-11 DIAGNOSIS — R1032 Left lower quadrant pain: Secondary | ICD-10-CM | POA: Diagnosis present

## 2021-11-11 LAB — URINALYSIS, ROUTINE W REFLEX MICROSCOPIC
Bilirubin Urine: NEGATIVE
Glucose, UA: NEGATIVE mg/dL
Hgb urine dipstick: NEGATIVE
Ketones, ur: NEGATIVE mg/dL
Leukocytes,Ua: NEGATIVE
Nitrite: NEGATIVE
Protein, ur: NEGATIVE mg/dL
Specific Gravity, Urine: 1.021 (ref 1.005–1.030)
pH: 5.5 (ref 5.0–8.0)

## 2021-11-11 LAB — COMPREHENSIVE METABOLIC PANEL
ALT: 11 U/L (ref 0–44)
AST: 17 U/L (ref 15–41)
Albumin: 4.5 g/dL (ref 3.5–5.0)
Alkaline Phosphatase: 63 U/L (ref 38–126)
Anion gap: 10 (ref 5–15)
BUN: 15 mg/dL (ref 8–23)
CO2: 25 mmol/L (ref 22–32)
Calcium: 9.6 mg/dL (ref 8.9–10.3)
Chloride: 102 mmol/L (ref 98–111)
Creatinine, Ser: 1.06 mg/dL (ref 0.61–1.24)
GFR, Estimated: >60 mL/min (ref >60)
Glucose, Bld: 108 mg/dL — ABNORMAL HIGH (ref 70–99)
Potassium: 4.3 mmol/L (ref 3.5–5.1)
Sodium: 137 mmol/L (ref 135–145)
Total Bilirubin: 0.8 mg/dL (ref 0.3–1.2)
Total Protein: 7.3 g/dL (ref 6.5–8.1)

## 2021-11-11 LAB — CBC
HCT: 44.1 % (ref 39.0–52.0)
Hemoglobin: 15.2 g/dL (ref 13.0–17.0)
MCH: 32.6 pg (ref 26.0–34.0)
MCHC: 34.5 g/dL (ref 30.0–36.0)
MCV: 94.6 fL (ref 80.0–100.0)
Platelets: 224 10*3/uL (ref 150–400)
RBC: 4.66 MIL/uL (ref 4.22–5.81)
RDW: 12.1 % (ref 11.5–15.5)
WBC: 8.6 10*3/uL (ref 4.0–10.5)
nRBC: 0 % (ref 0.0–0.2)

## 2021-11-11 LAB — LIPASE, BLOOD: Lipase: 23 U/L (ref 11–51)

## 2021-11-11 MED ORDER — METRONIDAZOLE 500 MG/100ML IV SOLN
500.0000 mg | Freq: Once | INTRAVENOUS | Status: AC
  Administered 2021-11-11: 500 mg via INTRAVENOUS
  Filled 2021-11-11: qty 100

## 2021-11-11 MED ORDER — CIPROFLOXACIN IN D5W 400 MG/200ML IV SOLN
400.0000 mg | Freq: Once | INTRAVENOUS | Status: AC
  Administered 2021-11-11: 400 mg via INTRAVENOUS
  Filled 2021-11-11: qty 200

## 2021-11-11 MED ORDER — AMOXICILLIN-POT CLAVULANATE 875-125 MG PO TABS: 1.0000 | ORAL_TABLET | Freq: Two times a day (BID) | ORAL | 0 refills | Status: AC

## 2021-11-11 MED ORDER — IOHEXOL 300 MG/ML  SOLN
100.0000 mL | Freq: Once | INTRAMUSCULAR | Status: AC | PRN
  Administered 2021-11-11: 100 mL via INTRAVENOUS

## 2021-11-11 NOTE — Discharge Instructions (Addendum)
You were given a prescription for antibiotics. Please take the antibiotic prescription fully.  ? ?Please follow up with your gastroenterologist at [facility] within the next 5-7 days.  If you do not have a primary care provider, information for a healthcare clinic has been provided for you to make arrangements for follow up care. Please return to the ER sooner if you have any new or worsening symptoms, or if you have any of the following symptoms: ? ?Abdominal pain that does not go away.  ?You have a fever.  ?You keep throwing up (vomiting).  ?The pain is felt only in portions of the abdomen. Pain in the right side could possibly be appendicitis. In an adult, pain in the left lower portion of the abdomen could be colitis or diverticulitis.  ?You pass bloody or black tarry stools.  ?There is bright red blood in the stool.  ?The constipation stays for more than 4 days.  ?There is belly (abdominal) or rectal pain.  ?You do not seem to be getting better.  ?You have any questions or concerns.  ?' ?

## 2021-11-11 NOTE — ED Triage Notes (Signed)
Pt arrives with c/o ABD pain that started last yesterday. Pt has pain in the LLQ pain. Pt denies n/v/d or fevers.  ?

## 2021-11-11 NOTE — ED Provider Notes (Signed)
?[facility] ?Provider Note ? ? ?CSN: [id number] ?Arrival date & time: 11/11/21  1830 ? ?  ? ?History ? ?Chief Complaint  ?Patient presents with  ? Abdominal Pain  ? ? ?Charles Salas is a 76 y.o. male. ? ?HPI ? ?76 year old male with a history of elevated LFTs, PSA elevation, arthritis, dysphagia, GERD, hyperlipidemia, hypertension, prostate cancer, right bundle branch block, sleep apnea, who presents to the emergency department today for evaluation of abdominal pain.  He states he had diarrhea for the last 4 days and an upset stomach.  Today he noticed that he had some pain to the left lower abdomen.  He called his PCP who advised him to come the ED for rule out diverticulitis.  He denies any associated nausea and vomiting.  Denies any fevers or chills but does report he has been feeling generally unwell for the last few days. ? ?Home Medications ?Prior to Admission medications   ?Medication Sig Start Date End Date Taking? Authorizing Provider  ?amoxicillin-clavulanate (AUGMENTIN) 875-125 MG tablet Take 1 tablet by mouth 2 (two) times daily for 7 days. 11/11/21 11/18/21 Yes Dijon Kohlman S, PA-C  ?lisinopril (PRINIVIL,ZESTRIL) 10 MG tablet Take 20 mg by mouth daily.     [provider]  ?Multiple Vitamins-Minerals (MULTIVITAMIN WITH MINERALS) tablet Take 1 tablet by mouth daily.    [provider]  ?   ? ?Allergies    ?Augmentin [amoxicillin-pot clavulanate]   ? ?Review of Systems   ?Review of Systems ?See HPI for pertinent positives or negatives. ? ? ?Physical Exam ?Updated Vital Signs ?BP 132/71   Pulse 63   Temp 99.2 ?F (37.3 ?C)   Resp 14   Ht 6' (1.829 m)   Wt 88.5 kg   SpO2 97%   BMI 26.45 kg/m?  ?Physical Exam ?Vitals and nursing note reviewed.  ?Constitutional:   ?   General: He is not in acute distress. ?   Appearance: He is well-developed.  ?HENT:  ?   Head: Normocephalic and atraumatic.  ?Eyes:  ?   Conjunctiva/sclera: Conjunctivae normal.   ?Cardiovascular:  ?   Rate and Rhythm: Normal rate and regular rhythm.  ?   Heart sounds: No murmur heard. ?Pulmonary:  ?   Effort: Pulmonary effort is normal. No respiratory distress.  ?   Breath sounds: Normal breath sounds.  ?Abdominal:  ?   General: Bowel sounds are normal.  ?   Palpations: Abdomen is soft.  ?   Tenderness: There is abdominal tenderness in the left lower quadrant. There is guarding. There is no right CVA tenderness, left CVA tenderness or rebound.  ?Musculoskeletal:     ?   General: No swelling.  ?   Cervical back: Neck supple.  ?Skin: ?   General: Skin is warm and dry.  ?   Capillary Refill: Capillary refill takes less than 2 seconds.  ?Neurological:  ?   Mental Status: He is alert.  ?Psychiatric:     ?   Mood and Affect: Mood normal.  ? ? ? ?ED Results / Procedures / Treatments   ?Labs ?(all labs ordered are listed, but only abnormal results are displayed) ?Labs Reviewed  ?COMPREHENSIVE METABOLIC PANEL - Abnormal; Notable for the following components:  ?    Result Value  ? Glucose, Bld 108 (*)   ? All other components within normal limits  ?LIPASE, BLOOD  ?CBC  ?URINALYSIS, ROUTINE W REFLEX MICROSCOPIC  ? ? ?EKG ?None ? ?Radiology ?CT ABDOMEN  PELVIS W CONTRAST ? ?Result Date: 11/11/2021 ?CLINICAL DATA:  Left lower quadrant pain. EXAM: CT ABDOMEN AND PELVIS WITH CONTRAST TECHNIQUE: Multidetector CT imaging of the abdomen and pelvis was performed using the standard protocol following bolus administration of intravenous contrast. RADIATION DOSE REDUCTION: This exam was performed according to the departmental dose-optimization program which includes automated exposure control, adjustment of the mA and/or kV according to patient size and/or use of iterative reconstruction technique. CONTRAST:  142m OMNIPAQUE IOHEXOL 300 MG/ML  SOLN COMPARISON:  None. FINDINGS: Lower chest: No acute abnormality. Hepatobiliary: 19 cm length with uniform enhancement. Gallbladder and bile ducts are unremarkable.  Pancreas: Unremarkable. Spleen: Unremarkable. Adrenals/Urinary Tract: There is no adrenal mass or significant abnormality of the renal cortex. There is a 1.3 cm cyst in the inferior pole right kidney of 5.7 Hounsfield units. There is no urinary stone or obstruction. Unremarkable bladder wall noted. Stomach/Bowel: The gastric wall is contracted. The unopacified small bowel is unremarkable. The appendix is normal caliber and well visible. There is mild fecal retention ascending and proximal transverse colon, multiple diverticula left-sided colon with wall thickening and moderate inflammatory reaction involving a 6 cm segment of the distal descending colon consistent with acute diverticulitis. No perforation or abscess is seen. Sigmoid diverticula are uncomplicated. Vascular/Lymphatic: Aortic atherosclerosis. No enlarged abdominal or pelvic lymph nodes. Reproductive: There are numerous brachytherapy seeds in the prostate bed. No prostatic enlargement is seen. There is streak artifact in the area from the metallic seeds. There are left pelvic phleboliths. Other: There is trace reactive fluid in the distal left paracolic gutter, nonlocalizing. There is no free air, hemorrhage or abscess. There is no incarcerated hernia. Abdominal wall is intact. Musculoskeletal: No acute or significant osseous findings. Bridging osteophytes anterior left SI joint. Mild degenerative change thoracic and lumbar spine. IMPRESSION: 1. Acute diverticulitis of the distal descending colon. Colonoscopy follow-up recommended after treatment to exclude underlying lesion unless recently performed. 2. Minimal nonlocalizing reactive fluid in the distal left paracolic gutter but no abscess or free air. 3. Constipation without evidence of small-bowel obstruction or inflammation. 4. Small right renal cyst. 5. Aortic atherosclerosis. 6. Prior prostate brachytherapy. Electronically Signed   By: KTelford NabM.D.   On: 11/11/2021 20:25    ? ?Procedures ?Procedures  ? ? ?Medications Ordered in ED ?Medications  ?ciprofloxacin (CIPRO) IVPB 400 mg (400 mg Intravenous New Bag/Given 11/11/21 2102)  ?metroNIDAZOLE (FLAGYL) IVPB 500 mg (has no administration in time range)  ?iohexol (OMNIPAQUE) 300 MG/ML solution 100 mL (100 mLs Intravenous Contrast Given 11/11/21 2001)  ? ? ?ED Course/ Medical Decision Making/ A&P ?  ?                        ?Medical Decision Making ?Amount and/or Complexity of Data Reviewed ?Labs: ordered. ?Radiology: ordered. ? ?Risk ?Prescription drug management. ? ? ?This patient presents to the ED for concern of abd pain and diarrhea, this involves an extensive number of treatment options, and is a complaint that carries with it a high risk of complications and morbidity.  The differential diagnosis includes but is not limited to gastritis/PUD, enteritis/duodenitis, appendicitis, cholelithiasis/cholecystitis, cholangitis, pancreatitis, ruptured viscus, colitis, diverticulitis, proctitis, cystitis, pyelonephritis, ureteral colic, aortic dissection, aortic aneurysm.  ? ?Comorbidities that complicate the patient evaluation: ?Patient?s presentation is complicated by their history of hld, htn, lfts, gerd ? ?Additional history obtained: ?Records reviewed Care Everywhere/External Records ? ?Lab Tests: ?I Ordered, and personally interpreted labs.  The pertinent results include:   ?  CBC w/o leukocytosis or anemia ?CMP unremarkable ?Lipase wnl ?UA wnl ? ?Imaging Studies ordered: ?I ordered, independently visualized, and interpreted imaging which showed  ?CT abd/pelvis - 1. Acute diverticulitis of the distal descending colon. Colonoscopy follow-up recommended after treatment to exclude underlying lesion unless recently performed. 2. Minimal nonlocalizing reactive fluid in the distal left paracolic gutter but no abscess or free air. 3. Constipation without evidence of small-bowel obstruction or inflammation. 4. Small right renal cyst. 5. Aortic  atherosclerosis. 6. Prior prostate brachytherapy.   ? ?I agree with the radiologist interpretation ? ?Medicines ordered and prescription drug management: ?I ordered medication including abx  for diverticulitis  ? ?Critical Interventions: ab

## 2022-10-10 DIAGNOSIS — C61 Malignant neoplasm of prostate: Secondary | ICD-10-CM | POA: Diagnosis not present

## 2022-10-10 DIAGNOSIS — N401 Enlarged prostate with lower urinary tract symptoms: Secondary | ICD-10-CM | POA: Diagnosis not present

## 2022-10-10 DIAGNOSIS — R3912 Poor urinary stream: Secondary | ICD-10-CM | POA: Diagnosis not present

## 2022-11-14 DIAGNOSIS — I10 Essential (primary) hypertension: Secondary | ICD-10-CM | POA: Diagnosis not present

## 2022-11-14 DIAGNOSIS — M25512 Pain in left shoulder: Secondary | ICD-10-CM | POA: Diagnosis not present

## 2022-11-14 DIAGNOSIS — E785 Hyperlipidemia, unspecified: Secondary | ICD-10-CM | POA: Diagnosis not present

## 2022-11-14 DIAGNOSIS — I700 Atherosclerosis of aorta: Secondary | ICD-10-CM | POA: Diagnosis not present

## 2022-11-14 DIAGNOSIS — C61 Malignant neoplasm of prostate: Secondary | ICD-10-CM | POA: Diagnosis not present

## 2023-01-08 DIAGNOSIS — H31003 Unspecified chorioretinal scars, bilateral: Secondary | ICD-10-CM | POA: Diagnosis not present

## 2023-01-08 DIAGNOSIS — H2513 Age-related nuclear cataract, bilateral: Secondary | ICD-10-CM | POA: Diagnosis not present

## 2023-01-08 DIAGNOSIS — H35373 Puckering of macula, bilateral: Secondary | ICD-10-CM | POA: Diagnosis not present

## 2023-01-08 DIAGNOSIS — H5203 Hypermetropia, bilateral: Secondary | ICD-10-CM | POA: Diagnosis not present

## 2023-01-08 DIAGNOSIS — H43813 Vitreous degeneration, bilateral: Secondary | ICD-10-CM | POA: Diagnosis not present

## 2023-01-08 DIAGNOSIS — H524 Presbyopia: Secondary | ICD-10-CM | POA: Diagnosis not present

## 2023-04-12 DIAGNOSIS — G4733 Obstructive sleep apnea (adult) (pediatric): Secondary | ICD-10-CM | POA: Diagnosis not present

## 2023-04-25 DIAGNOSIS — M1712 Unilateral primary osteoarthritis, left knee: Secondary | ICD-10-CM | POA: Diagnosis not present

## 2023-07-15 DIAGNOSIS — I10 Essential (primary) hypertension: Secondary | ICD-10-CM | POA: Diagnosis not present

## 2023-07-15 DIAGNOSIS — C61 Malignant neoplasm of prostate: Secondary | ICD-10-CM | POA: Diagnosis not present

## 2023-07-15 DIAGNOSIS — E785 Hyperlipidemia, unspecified: Secondary | ICD-10-CM | POA: Diagnosis not present

## 2023-07-15 DIAGNOSIS — R7989 Other specified abnormal findings of blood chemistry: Secondary | ICD-10-CM | POA: Diagnosis not present

## 2023-07-15 DIAGNOSIS — Z79899 Other long term (current) drug therapy: Secondary | ICD-10-CM | POA: Diagnosis not present

## 2023-07-15 DIAGNOSIS — D7589 Other specified diseases of blood and blood-forming organs: Secondary | ICD-10-CM | POA: Diagnosis not present

## 2023-07-24 DIAGNOSIS — I700 Atherosclerosis of aorta: Secondary | ICD-10-CM | POA: Diagnosis not present

## 2023-07-24 DIAGNOSIS — Z6827 Body mass index (BMI) 27.0-27.9, adult: Secondary | ICD-10-CM | POA: Diagnosis not present

## 2023-07-24 DIAGNOSIS — E538 Deficiency of other specified B group vitamins: Secondary | ICD-10-CM | POA: Diagnosis not present

## 2023-07-24 DIAGNOSIS — Z1331 Encounter for screening for depression: Secondary | ICD-10-CM | POA: Diagnosis not present

## 2023-07-24 DIAGNOSIS — I10 Essential (primary) hypertension: Secondary | ICD-10-CM | POA: Diagnosis not present

## 2023-07-24 DIAGNOSIS — E785 Hyperlipidemia, unspecified: Secondary | ICD-10-CM | POA: Diagnosis not present

## 2023-07-24 DIAGNOSIS — Z0000 Encounter for general adult medical examination without abnormal findings: Secondary | ICD-10-CM | POA: Diagnosis not present

## 2023-07-24 DIAGNOSIS — Z1339 Encounter for screening examination for other mental health and behavioral disorders: Secondary | ICD-10-CM | POA: Diagnosis not present

## 2023-07-24 DIAGNOSIS — Z23 Encounter for immunization: Secondary | ICD-10-CM | POA: Diagnosis not present

## 2023-08-29 DIAGNOSIS — M79671 Pain in right foot: Secondary | ICD-10-CM | POA: Diagnosis not present

## 2023-08-29 DIAGNOSIS — M25562 Pain in left knee: Secondary | ICD-10-CM | POA: Diagnosis not present

## 2023-08-29 DIAGNOSIS — Z96651 Presence of right artificial knee joint: Secondary | ICD-10-CM | POA: Diagnosis not present

## 2023-09-02 ENCOUNTER — Encounter: Payer: Self-pay | Admitting: Nurse Practitioner

## 2023-09-02 ENCOUNTER — Ambulatory Visit: Payer: PPO | Admitting: Nurse Practitioner

## 2023-09-02 VITALS — BP 120/62 | HR 77 | Resp 16 | Ht 72.0 in | Wt 209.0 lb

## 2023-09-02 DIAGNOSIS — G4733 Obstructive sleep apnea (adult) (pediatric): Secondary | ICD-10-CM | POA: Diagnosis not present

## 2023-09-02 NOTE — Progress Notes (Signed)
 @Patient  ID: Charles Salas, male    DOB: [birth year], 78 y.o.   MRN: [id number]  Chief Complaint  Patient presents with   Follow-up    CPAP- doing ok with CPAP    Referring provider: Stephane Leita DEL, MD  HPI: 78 year old male, never smoker followed for OSA on CPAP. He is a former patient of Dr. Magdaleno and last seen in office RBBB, GERD, hx of prostate cancer, HLD.  TEST/EVENTS:  2013 PSH: AHI 34 05/22/2017 HST: AHI 20.3, SpO2 low 85%  07/26/2021: OV with Dr. Shellia. Uses CPAP nightly. 6 cmH2O. Full face mask. Seems to work better than nasal masks when he gets congestion. Should be eligible 2023 for new machine.   09/02/2023: Today - follow up Patient presents today for follow-up.  He wears his CPAP nightly.  Receives benefit from use.  Feels like energy levels are good during the day.  Wears a fullface mask.  No issues with pressures or leaks.  He does think he is due for a new machine.  Looks like he got his last 1 in 2018.  Denies any issues with drowsy driving or morning headaches.  08/02/2023-08/31/2023: CPAP 6 cmH2O 30/30 days; 100% >4 hr; average use 8 hr 9 min Leaks 6.1 AHI 3.1  Allergies  Allergen Reactions   Augmentin  [Amoxicillin -Pot Clavulanate] Diarrhea    Immunization History  Administered Date(s) Administered   DTaP 01/19/2015   Influenza Split 05/21/2011, 06/20/2012, 05/24/2021   Influenza Whole 06/16/2013   Influenza, High Dose Seasonal PF 05/22/2018   Influenza, Quadrivalent, Recombinant, Inj, Pf 06/03/2017, 05/30/2020   Influenza,inj,Quad PF,6+ Mos 05/04/2015, 06/26/2016   Influenza,inj,quad, With Preservative 05/19/2019   Influenza-Unspecified 05/16/2020, 05/21/2023   PFIZER(Purple Top)SARS-COV-2 Vaccination 09/17/2019, 10/15/2019, 05/16/2020, 05/30/2020, 05/24/2021   PNEUMOCOCCAL CONJUGATE-20 06/30/2021   Pneumococcal Conjugate-13 05/04/2015, 06/03/2017   Pneumococcal Polysaccharide-23 06/26/2016, 06/30/2021   RSV,unspecified 06/24/2023   Td  (Adult) 12/20/2018   Tdap 01/19/2015, 12/20/2018   Zoster, Live 05/21/2011    Past Medical History:  Diagnosis Date   Arthritis    bilateral knees   Corns and callosities    Dysphagia    Elevated liver enzymes 12/29/2014   Fracture of scapula, right, closed 2013   GERD (gastroesophageal reflux disease)    Headache(784.0)    Hyperlipidemia    Hypertension    Prostate cancer (HCC)    PSA elevation 12/29/2014   RBBB (right bundle branch block)    Sleep apnea     Tobacco History: Social History   Tobacco Use  Smoking Status Never  Smokeless Tobacco Never   Counseling given: Not Answered   Outpatient Medications Prior to Visit  Medication Sig Dispense Refill   lisinopril (PRINIVIL,ZESTRIL) 10 MG tablet Take 20 mg by mouth daily.      rosuvastatin (CRESTOR) 5 MG tablet Take 5 mg by mouth daily.     Multiple Vitamins-Minerals (MULTIVITAMIN WITH MINERALS) tablet Take 1 tablet by mouth daily.     No facility-administered medications prior to visit.     Review of Systems:   Constitutional: No weight loss or gain, night sweats, fevers, chills, fatigue, or lassitude. HEENT: No headaches, difficulty swallowing, tooth/dental problems, or sore throat. No sneezing, itching, ear ache, nasal congestion, or post nasal drip CV:  No chest pain, orthopnea, PND, swelling in lower extremities, anasarca, dizziness, palpitations, syncope Resp: No shortness of breath with exertion or at rest. No excess mucus or change in color of mucus. No productive or non-productive. No hemoptysis. No wheezing.  No chest wall deformity GI:  No heartburn, indigestion GU: No nocturia Neuro: No dizziness or lightheadedness.  Psych: No depression or anxiety. Mood stable.     Physical Exam:  BP 120/62   Pulse 77   Resp 16   Ht 6' (1.829 m)   Wt 209 lb (94.8 kg)   SpO2 99%   BMI 28.35 kg/m   GEN: Pleasant, interactive, well-appearing; in no acute distress HEENT:  Normocephalic and atraumatic.  PERRLA. Sclera white. Nasal turbinates pink, moist and patent bilaterally. No rhinorrhea present. Oropharynx pink and moist, without exudate or edema. No lesions, ulcerations, or postnasal drip. Mallampati II NECK:  Supple w/ fair ROM. Thyroid  symmetrical with no goiter or nodules palpated. No lymphadenopathy.   CV: RRR, no m/r/g, no peripheral edema. Pulses intact, +2 bilaterally. No cyanosis, pallor or clubbing. PULMONARY:  Unlabored, regular breathing. Clear bilaterally A&P w/o wheezes/rales/rhonchi. No accessory muscle use.  GI: BS present and normoactive. Soft, non-tender to palpation. No organomegaly or masses detected. MSK: No erythema, warmth or tenderness. Cap refil <2 sec all extrem. No deformities or joint swelling noted.  Neuro: A/Ox3. No focal deficits noted.   Skin: Warm, no lesions or rashe Psych: Normal affect and behavior. Judgement and thought content appropriate.     Lab Results:  CBC    Component Value Date/Time   WBC 8.6 11/11/2021 1841   RBC 4.66 11/11/2021 1841   HGB 15.2 11/11/2021 1841   HCT 44.1 11/11/2021 1841   PLT 224 11/11/2021 1841   MCV 94.6 11/11/2021 1841   MCH 32.6 11/11/2021 1841   MCHC 34.5 11/11/2021 1841   RDW 12.1 11/11/2021 1841   RDW 14.0 12/17/2013 0811   LYMPHSABS 1.3 12/17/2013 0811   EOSABS 0.1 12/17/2013 0811   BASOSABS 0.0 12/17/2013 0811    BMET    Component Value Date/Time   NA 137 11/11/2021 1841   NA 140 12/30/2015 0809   K 4.3 11/11/2021 1841   CL 102 11/11/2021 1841   CO2 25 11/11/2021 1841   GLUCOSE 108 (H) 11/11/2021 1841   BUN 15 11/11/2021 1841   BUN 19 12/30/2015 0809   CREATININE 1.06 11/11/2021 1841   CREATININE 1.21 01/21/2017 0812   CALCIUM 9.6 11/11/2021 1841   GFRNONAA >60 11/11/2021 1841   GFRNONAA 61 01/21/2017 0812   GFRAA >60 11/06/2018 0841   GFRAA 71 01/21/2017 0812    BNP No results found for: BNP   Imaging:  No results found.  Administration History     None           No data  to display          No results found for: NITRICOXIDE      Assessment & Plan:   OSA on CPAP Moderate OSA on CPAP. Excellent compliance and control. Receives benefit from use. Aware of proper care/use of device. Due for new CPAP machine. Orders placed for CPAP 6 cmH2O, mask of choice and heated humidity. Will need f/u within 90 days for compliance check. Safe driving practices reviewed. Understands risks of untreated OSA.  Patient Instructions  Continue to use CPAP every night, minimum of 4-6 hours a night.  Change equipment as directed. Wash your tubing with warm soap and water  daily, hang to dry. Wash humidifier portion weekly. Use bottled, distilled water  and change daily Be aware of reduced alertness and do not drive or operate heavy machinery if experiencing this or drowsiness.  Exercise encouraged, as tolerated. Healthy weight management discussed.  Avoid  or decrease alcohol  consumption and medications that make you more sleepy, if possible. Notify if persistent daytime sleepiness occurs even with consistent use of PAP therapy.  We discussed how untreated sleep apnea puts an individual at risk for cardiac arrhthymias, pulm HTN, DM, stroke and increases their risk for daytime accidents.   Orders for new CPAP machine   Follow up in 10-12 weeks with Katie Virgilio Broadhead,NP. If symptoms do not improve or worsen, please contact office for sooner follow up or seek emergency care.    Advised if symptoms do not improve or worsen, to please contact office for sooner follow up or seek emergency care.   I spent 31 minutes of dedicated to the care of this patient on the date of this encounter to include pre-visit review of records, face-to-face time with the patient discussing conditions above, post visit ordering of testing, clinical documentation with the electronic health record, making appropriate referrals as documented, and communicating necessary findings to members of the patients care  team.  Comer LULLA Rouleau, NP 09/02/2023  Pt aware and understands NP's role.

## 2023-09-02 NOTE — Assessment & Plan Note (Signed)
 Moderate OSA on CPAP. Excellent compliance and control. Receives benefit from use. Aware of proper care/use of device. Due for new CPAP machine. Orders placed for CPAP 6 cmH2O, mask of choice and heated humidity. Will need f/u within 90 days for compliance check. Safe driving practices reviewed. Understands risks of untreated OSA.  Patient Instructions  Continue to use CPAP every night, minimum of 4-6 hours a night.  Change equipment as directed. Wash your tubing with warm soap and water  daily, hang to dry. Wash humidifier portion weekly. Use bottled, distilled water  and change daily Be aware of reduced alertness and do not drive or operate heavy machinery if experiencing this or drowsiness.  Exercise encouraged, as tolerated. Healthy weight management discussed.  Avoid or decrease alcohol  consumption and medications that make you more sleepy, if possible. Notify if persistent daytime sleepiness occurs even with consistent use of PAP therapy.  We discussed how untreated sleep apnea puts an individual at risk for cardiac arrhthymias, pulm HTN, DM, stroke and increases their risk for daytime accidents.   Orders for new CPAP machine   Follow up in 10-12 weeks with Charles Alyscia Carmon,NP. If symptoms do not improve or worsen, please contact office for sooner follow up or seek emergency care.

## 2023-09-02 NOTE — Patient Instructions (Addendum)
 Continue to use CPAP every night, minimum of 4-6 hours a night.  Change equipment as directed. Wash your tubing with warm soap and water  daily, hang to dry. Wash humidifier portion weekly. Use bottled, distilled water  and change daily Be aware of reduced alertness and do not drive or operate heavy machinery if experiencing this or drowsiness.  Exercise encouraged, as tolerated. Healthy weight management discussed.  Avoid or decrease alcohol  consumption and medications that make you more sleepy, if possible. Notify if persistent daytime sleepiness occurs even with consistent use of PAP therapy.  We discussed how untreated sleep apnea puts an individual at risk for cardiac arrhthymias, pulm HTN, DM, stroke and increases their risk for daytime accidents.   Orders for new CPAP machine   Follow up in 10-12 weeks with Charles Juanita Devincent,NP. If symptoms do not improve or worsen, please contact office for sooner follow up or seek emergency care.

## 2023-09-13 DIAGNOSIS — M722 Plantar fascial fibromatosis: Secondary | ICD-10-CM | POA: Diagnosis not present

## 2023-09-13 DIAGNOSIS — M79644 Pain in right finger(s): Secondary | ICD-10-CM | POA: Diagnosis not present

## 2023-09-13 DIAGNOSIS — M65849 Other synovitis and tenosynovitis, unspecified hand: Secondary | ICD-10-CM | POA: Diagnosis not present

## 2023-09-17 DIAGNOSIS — M79671 Pain in right foot: Secondary | ICD-10-CM | POA: Diagnosis not present

## 2023-09-26 DIAGNOSIS — M722 Plantar fascial fibromatosis: Secondary | ICD-10-CM | POA: Diagnosis not present

## 2023-10-03 DIAGNOSIS — G4733 Obstructive sleep apnea (adult) (pediatric): Secondary | ICD-10-CM | POA: Diagnosis not present

## 2023-10-08 DIAGNOSIS — M722 Plantar fascial fibromatosis: Secondary | ICD-10-CM | POA: Diagnosis not present

## 2023-10-09 DIAGNOSIS — C61 Malignant neoplasm of prostate: Secondary | ICD-10-CM | POA: Diagnosis not present

## 2023-10-16 DIAGNOSIS — C61 Malignant neoplasm of prostate: Secondary | ICD-10-CM | POA: Diagnosis not present

## 2023-10-28 DIAGNOSIS — M722 Plantar fascial fibromatosis: Secondary | ICD-10-CM | POA: Diagnosis not present

## 2023-10-28 DIAGNOSIS — Z6827 Body mass index (BMI) 27.0-27.9, adult: Secondary | ICD-10-CM | POA: Diagnosis not present

## 2023-10-28 DIAGNOSIS — M79671 Pain in right foot: Secondary | ICD-10-CM | POA: Diagnosis not present

## 2023-10-28 DIAGNOSIS — E663 Overweight: Secondary | ICD-10-CM | POA: Diagnosis not present

## 2023-10-28 DIAGNOSIS — R768 Other specified abnormal immunological findings in serum: Secondary | ICD-10-CM | POA: Diagnosis not present

## 2023-10-28 DIAGNOSIS — M79672 Pain in left foot: Secondary | ICD-10-CM | POA: Diagnosis not present

## 2023-10-31 DIAGNOSIS — G4733 Obstructive sleep apnea (adult) (pediatric): Secondary | ICD-10-CM | POA: Diagnosis not present

## 2023-11-05 DIAGNOSIS — M722 Plantar fascial fibromatosis: Secondary | ICD-10-CM | POA: Diagnosis not present

## 2023-11-16 DIAGNOSIS — G4733 Obstructive sleep apnea (adult) (pediatric): Secondary | ICD-10-CM | POA: Diagnosis not present

## 2023-11-19 DIAGNOSIS — M722 Plantar fascial fibromatosis: Secondary | ICD-10-CM | POA: Diagnosis not present

## 2023-11-21 DIAGNOSIS — M722 Plantar fascial fibromatosis: Secondary | ICD-10-CM | POA: Diagnosis not present

## 2023-11-28 ENCOUNTER — Ambulatory Visit: Payer: PPO | Admitting: Nurse Practitioner

## 2023-11-28 ENCOUNTER — Encounter: Payer: Self-pay | Admitting: Nurse Practitioner

## 2023-11-28 DIAGNOSIS — J301 Allergic rhinitis due to pollen: Secondary | ICD-10-CM | POA: Diagnosis not present

## 2023-11-28 DIAGNOSIS — G4733 Obstructive sleep apnea (adult) (pediatric): Secondary | ICD-10-CM | POA: Diagnosis not present

## 2023-11-28 DIAGNOSIS — J309 Allergic rhinitis, unspecified: Secondary | ICD-10-CM | POA: Insufficient documentation

## 2023-11-28 NOTE — Progress Notes (Signed)
 @Patient  ID: Charles Salas, male    DOB: October 02, 1945, 78 y.o.   MRN: [id number]  Chief Complaint  Patient presents with   Follow-up    Doing well    Referring provider: Melida Quitter, MD  HPI: 78 year old male, never smoker followed for OSA on CPAP. He is a former patient of Dr. Evlyn Courier and last seen in office RBBB, GERD, hx of prostate cancer, HLD.  TEST/EVENTS:  2013 PSG: AHI 34 05/22/2017 HST: AHI 20.3, SpO2 low 85%  07/26/2021: OV with Dr. Craige Cotta. Uses CPAP nightly. 6 cmH2O. Full face mask. Seems to work better than nasal masks when he gets congestion. Should be eligible 2023 for new machine.   09/02/2023: OV with Nyeem Stoke NP for follow-up.  He wears his CPAP nightly.  Receives benefit from use.  Feels like energy levels are good during the day.  Wears a fullface mask.  No issues with pressures or leaks.  He does think he is due for a new machine.  Looks like he got his last 1 in 2018.  Denies any issues with drowsy driving or morning headaches. 08/02/2023-08/31/2023: CPAP 6 cmH2O 30/30 days; 100% >4 hr; average use 8 hr 9 min Leaks 6.1 AHI 3.1  11/28/2023: Today - follow up Patient presents today for follow up. He received his new CPAP machine. Doing well with it. Feels like his energy levels are good and sleeps restfully at night. Sometimes the air does leak and makes his eyes dry. He's wearing a full face mask currently. Hasn't ever tried alternative mask options. Otherwise, no concerns or complaints. He doesn't have any issues with drowsy driving or morning headaches. He is having some allergy symptoms. Takes claritin, which helps, but sometimes still has a little more congestion at night. Does have flonase but not using it.   10/03/2023-11/27/2023 CPAP 6 cmH2O 54/56 days; 96% >4 hr; average use 7 hr Leaks 3.9 AHI 4.2  Allergies  Allergen Reactions   Augmentin [Amoxicillin-Pot Clavulanate] Diarrhea    Immunization History  Administered Date(s) Administered   DTaP  01/19/2015   Influenza Split 05/21/2011, 06/20/2012, 05/24/2021   Influenza Whole 06/16/2013   Influenza, High Dose Seasonal PF 05/22/2018   Influenza, Quadrivalent, Recombinant, Inj, Pf 06/03/2017, 05/30/2020   Influenza,inj,Quad PF,6+ Mos 05/04/2015, 06/26/2016   Influenza,inj,quad, With Preservative 05/19/2019   Influenza-Unspecified 05/16/2020, 05/21/2023   PFIZER(Purple Top)SARS-COV-2 Vaccination 09/17/2019, 10/15/2019, 05/16/2020, 05/30/2020, 05/24/2021   PNEUMOCOCCAL CONJUGATE-20 06/30/2021   Pneumococcal Conjugate-13 05/04/2015, 06/03/2017   Pneumococcal Polysaccharide-23 06/26/2016, 06/30/2021   RSV,unspecified 06/24/2023   Td (Adult) 12/20/2018   Tdap 01/19/2015, 12/20/2018   Zoster, Live 05/21/2011    Past Medical History:  Diagnosis Date   Arthritis    bilateral knees   Corns and callosities    Dysphagia    Elevated liver enzymes 12/29/2014   Fracture of scapula, right, closed 2013   GERD (gastroesophageal reflux disease)    Headache(784.0)    Hyperlipidemia    Hypertension    Prostate cancer (HCC)    PSA elevation 12/29/2014   RBBB (right bundle branch block)    Sleep apnea     Tobacco History: Social History   Tobacco Use  Smoking Status Never  Smokeless Tobacco Never   Counseling given: Not Answered   Outpatient Medications Prior to Visit  Medication Sig Dispense Refill   lisinopril (PRINIVIL,ZESTRIL) 10 MG tablet Take 20 mg by mouth daily.      rosuvastatin (CRESTOR) 5 MG tablet Take 5 mg by  mouth daily.     No facility-administered medications prior to visit.     Review of Systems:   Constitutional: No weight loss or gain, night sweats, fevers, chills, fatigue, or lassitude. HEENT: No headaches, difficulty swallowing, tooth/dental problems, or sore throat. No sneezing, itching, ear ache, nasal congestion, or post nasal drip CV:  No chest pain, orthopnea, PND, swelling in lower extremities, anasarca, dizziness, palpitations, syncope Resp: No  shortness of breath with exertion or at rest. No excess mucus or change in color of mucus. No productive or non-productive. No hemoptysis. No wheezing.  No chest wall deformity GI:  No heartburn, indigestion GU: No nocturia Neuro: No dizziness or lightheadedness.  Psych: No depression or anxiety. Mood stable.     Physical Exam:  BP 124/78 (BP Location: Right Arm, Patient Position: Sitting)   Pulse 73   Ht 6' (1.829 m)   Wt 210 lb 12.8 oz (95.6 kg)   SpO2 99%   BMI 28.59 kg/m   GEN: Pleasant, interactive, well-appearing; in no acute distress HEENT:  Normocephalic and atraumatic. PERRLA. Sclera white. Nasal turbinates pink, moist and patent bilaterally. No rhinorrhea present. Oropharynx pink and moist, without exudate or edema. No lesions, ulcerations, or postnasal drip. Mallampati II NECK:  Supple w/ fair ROM. Thyroid symmetrical with no goiter or nodules palpated. No lymphadenopathy.   CV: RRR, no m/r/g, no peripheral edema. Pulses intact, +2 bilaterally. No cyanosis, pallor or clubbing. PULMONARY:  Unlabored, regular breathing. Clear bilaterally A&P w/o wheezes/rales/rhonchi. No accessory muscle use.  GI: BS present and normoactive. Soft, non-tender to palpation. No organomegaly or masses detected. MSK: No erythema, warmth or tenderness. Cap refil <2 sec all extrem. No deformities or joint swelling noted.  Neuro: A/Ox3. No focal deficits noted.   Skin: Warm, no lesions or rashe Psych: Normal affect and behavior. Judgement and thought content appropriate.     Lab Results:  CBC    Component Value Date/Time   WBC 8.6 11/11/2021 1841   RBC 4.66 11/11/2021 1841   HGB 15.2 11/11/2021 1841   HCT 44.1 11/11/2021 1841   PLT 224 11/11/2021 1841   MCV 94.6 11/11/2021 1841   MCH 32.6 11/11/2021 1841   MCHC 34.5 11/11/2021 1841   RDW 12.1 11/11/2021 1841   RDW 14.0 12/17/2013 0811   LYMPHSABS 1.3 12/17/2013 0811   EOSABS 0.1 12/17/2013 0811   BASOSABS 0.0 12/17/2013 0811     BMET    Component Value Date/Time   NA 137 11/11/2021 1841   NA 140 12/30/2015 0809   K 4.3 11/11/2021 1841   CL 102 11/11/2021 1841   CO2 25 11/11/2021 1841   GLUCOSE 108 (H) 11/11/2021 1841   BUN 15 11/11/2021 1841   BUN 19 12/30/2015 0809   CREATININE 1.06 11/11/2021 1841   CREATININE 1.21 01/21/2017 0812   CALCIUM 9.6 11/11/2021 1841   GFRNONAA >60 11/11/2021 1841   GFRNONAA 61 01/21/2017 0812   GFRAA >60 11/06/2018 0841   GFRAA 71 01/21/2017 0812    BNP No results found for: "BNP"   Imaging:  No results found.  Administration History     None           No data to display          No results found for: "NITRICOXIDE"      Assessment & Plan:   OSA on CPAP Moderate OSA on CPAP. Excellent compliance and control. Receives benefit from use. Aware of risks of untreated OSA and potential treatment options. Proper  care/use reviewed. No significant leaks. Will have him trial hybrid FFM with DreamWear full face mask to see if this helps with eye dryness. Encouraged continued use. Safe driving practices reviewed.   Patient Instructions  Continue to use CPAP every night, minimum of 4-6 hours a night.  Change equipment as directed. Wash your tubing with warm soap and water daily, hang to dry. Wash humidifier portion weekly. Use bottled, distilled water and change daily Be aware of reduced alertness and do not drive or operate heavy machinery if experiencing this or drowsiness.  Exercise encouraged, as tolerated. Healthy weight management discussed.  Avoid or decrease alcohol consumption and medications that make you more sleepy, if possible. Notify if persistent daytime sleepiness occurs even with consistent use of PAP therapy.   We discussed how untreated sleep apnea puts an individual at risk for cardiac arrhthymias, pulm HTN, DM, stroke and increases their risk for daytime accidents.    Try DreamWear full face mask and see how you like this  Use saline  rinses 1-2 times a day and follow with flonase 30 minutes later if you're feeling more congested that night from allergies   Follow up in 1 year with Katie Lebaron Bautch,NP. If symptoms do not improve or worsen, please contact office for sooner follow up or seek emergency care.    Allergic rhinitis Continue daily antihistamine. Add on saline rinses and intranasal steroid as needed for congestion   Advised if symptoms do not improve or worsen, to please contact office for sooner follow up or seek emergency care.   I spent 32 minutes of dedicated to the care of this patient on the date of this encounter to include pre-visit review of records, face-to-face time with the patient discussing conditions above, post visit ordering of testing, clinical documentation with the electronic health record, making appropriate referrals as documented, and communicating necessary findings to members of the patients care team.  Noemi Chapel, NP 11/28/2023  Pt aware and understands NP's role.

## 2023-11-28 NOTE — Assessment & Plan Note (Addendum)
 Moderate OSA on CPAP. Excellent compliance and control. Receives benefit from use. Aware of risks of untreated OSA and potential treatment options. Proper care/use reviewed. No significant leaks. Will have him trial hybrid FFM with DreamWear full face mask to see if this helps with eye dryness. Encouraged continued use. Safe driving practices reviewed.   Patient Instructions  Continue to use CPAP every night, minimum of 4-6 hours a night.  Change equipment as directed. Wash your tubing with warm soap and water daily, hang to dry. Wash humidifier portion weekly. Use bottled, distilled water and change daily Be aware of reduced alertness and do not drive or operate heavy machinery if experiencing this or drowsiness.  Exercise encouraged, as tolerated. Healthy weight management discussed.  Avoid or decrease alcohol consumption and medications that make you more sleepy, if possible. Notify if persistent daytime sleepiness occurs even with consistent use of PAP therapy.   We discussed how untreated sleep apnea puts an individual at risk for cardiac arrhthymias, pulm HTN, DM, stroke and increases their risk for daytime accidents.    Try DreamWear full face mask and see how you like this  Use saline rinses 1-2 times a day and follow with flonase 30 minutes later if you're feeling more congested that night from allergies   Follow up in 1 year with Katie Pia Jedlicka,NP. If symptoms do not improve or worsen, please contact office for sooner follow up or seek emergency care.

## 2023-11-28 NOTE — Patient Instructions (Signed)
 Continue to use CPAP every night, minimum of 4-6 hours a night.  Change equipment as directed. Wash your tubing with warm soap and water daily, hang to dry. Wash humidifier portion weekly. Use bottled, distilled water and change daily Be aware of reduced alertness and do not drive or operate heavy machinery if experiencing this or drowsiness.  Exercise encouraged, as tolerated. Healthy weight management discussed.  Avoid or decrease alcohol consumption and medications that make you more sleepy, if possible. Notify if persistent daytime sleepiness occurs even with consistent use of PAP therapy.   We discussed how untreated sleep apnea puts an individual at risk for cardiac arrhthymias, pulm HTN, DM, stroke and increases their risk for daytime accidents.    Try DreamWear full face mask and see how you like this  Use saline rinses 1-2 times a day and follow with flonase 30 minutes later if you're feeling more congested that night from allergies   Follow up in 1 year with Katie Kaeleen Odom,NP. If symptoms do not improve or worsen, please contact office for sooner follow up or seek emergency care.

## 2023-11-28 NOTE — Assessment & Plan Note (Addendum)
 Continue daily antihistamine. Add on saline rinses and intranasal steroid as needed for congestion

## 2023-12-01 DIAGNOSIS — G4733 Obstructive sleep apnea (adult) (pediatric): Secondary | ICD-10-CM | POA: Diagnosis not present

## 2023-12-31 DIAGNOSIS — G4733 Obstructive sleep apnea (adult) (pediatric): Secondary | ICD-10-CM | POA: Diagnosis not present

## 2024-01-14 DIAGNOSIS — H524 Presbyopia: Secondary | ICD-10-CM | POA: Diagnosis not present

## 2024-01-14 DIAGNOSIS — H04123 Dry eye syndrome of bilateral lacrimal glands: Secondary | ICD-10-CM | POA: Diagnosis not present

## 2024-01-14 DIAGNOSIS — H43813 Vitreous degeneration, bilateral: Secondary | ICD-10-CM | POA: Diagnosis not present

## 2024-01-14 DIAGNOSIS — H31003 Unspecified chorioretinal scars, bilateral: Secondary | ICD-10-CM | POA: Diagnosis not present

## 2024-01-14 DIAGNOSIS — H52201 Unspecified astigmatism, right eye: Secondary | ICD-10-CM | POA: Diagnosis not present

## 2024-01-14 DIAGNOSIS — H2513 Age-related nuclear cataract, bilateral: Secondary | ICD-10-CM | POA: Diagnosis not present

## 2024-01-14 DIAGNOSIS — H35373 Puckering of macula, bilateral: Secondary | ICD-10-CM | POA: Diagnosis not present

## 2024-01-14 DIAGNOSIS — H25012 Cortical age-related cataract, left eye: Secondary | ICD-10-CM | POA: Diagnosis not present

## 2024-01-31 DIAGNOSIS — G4733 Obstructive sleep apnea (adult) (pediatric): Secondary | ICD-10-CM | POA: Diagnosis not present

## 2024-02-08 DIAGNOSIS — G4733 Obstructive sleep apnea (adult) (pediatric): Secondary | ICD-10-CM | POA: Diagnosis not present

## 2024-03-01 DIAGNOSIS — G4733 Obstructive sleep apnea (adult) (pediatric): Secondary | ICD-10-CM | POA: Diagnosis not present

## 2024-04-01 DIAGNOSIS — G4733 Obstructive sleep apnea (adult) (pediatric): Secondary | ICD-10-CM | POA: Diagnosis not present

## 2024-04-28 DIAGNOSIS — M25511 Pain in right shoulder: Secondary | ICD-10-CM | POA: Diagnosis not present

## 2024-05-02 DIAGNOSIS — G4733 Obstructive sleep apnea (adult) (pediatric): Secondary | ICD-10-CM | POA: Diagnosis not present

## 2024-06-01 DIAGNOSIS — G4733 Obstructive sleep apnea (adult) (pediatric): Secondary | ICD-10-CM | POA: Diagnosis not present

## 2024-07-02 DIAGNOSIS — G4733 Obstructive sleep apnea (adult) (pediatric): Secondary | ICD-10-CM | POA: Diagnosis not present

## 2024-07-30 DIAGNOSIS — E785 Hyperlipidemia, unspecified: Secondary | ICD-10-CM | POA: Diagnosis not present

## 2024-09-10 ENCOUNTER — Ambulatory Visit (INDEPENDENT_AMBULATORY_CARE_PROVIDER_SITE_OTHER): Admitting: Physician Assistant

## 2024-09-10 ENCOUNTER — Encounter (INDEPENDENT_AMBULATORY_CARE_PROVIDER_SITE_OTHER): Payer: Self-pay | Admitting: Physician Assistant

## 2024-09-10 VITALS — BP 145/80 | HR 61 | Temp 98.0°F | Ht 72.0 in | Wt 200.0 lb

## 2024-09-10 DIAGNOSIS — H9319 Tinnitus, unspecified ear: Secondary | ICD-10-CM

## 2024-09-10 DIAGNOSIS — H903 Sensorineural hearing loss, bilateral: Secondary | ICD-10-CM

## 2024-09-10 DIAGNOSIS — H9193 Unspecified hearing loss, bilateral: Secondary | ICD-10-CM

## 2024-09-10 NOTE — Progress Notes (Unsigned)
 Patient took BP medication last night at around 10pm.

## 2024-09-11 ENCOUNTER — Ambulatory Visit (INDEPENDENT_AMBULATORY_CARE_PROVIDER_SITE_OTHER): Admitting: Physician Assistant

## 2024-09-11 ENCOUNTER — Ambulatory Visit (INDEPENDENT_AMBULATORY_CARE_PROVIDER_SITE_OTHER): Admitting: Audiology

## 2024-09-11 DIAGNOSIS — H903 Sensorineural hearing loss, bilateral: Secondary | ICD-10-CM | POA: Diagnosis not present

## 2024-09-11 DIAGNOSIS — H9193 Unspecified hearing loss, bilateral: Secondary | ICD-10-CM

## 2024-09-11 NOTE — Progress Notes (Signed)
" °  [address] [place], [place] [address] [phone]  Audiological Evaluation    Name: Charles Salas     DOB:   September 04, 1945      MRN:   [id number]                                                                                     Service Date: 09/11/2024     Accompanied by: self     Patient comes today after Reyes Cohen, PA-C sent a referral for a hearing evaluation due to concerns with hearing loss.   Symptoms Yes Details  Hearing loss  [x]  High frequency hearing loss for 20 year or more. His wife is complaining of his hearing loss.   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 Left ear:  Clear external ear canal and notable landmarks visualized on the tympanic membrane.  Tympanometry: Right ear: Type A - Normal external ear canal volume with normal middle ear pressure and normal tympanic membrane compliance. Findings are consistent with normal middle ear function. Left ear: Type A - Normal external ear canal volume with normal middle ear pressure and normal tympanic membrane compliance. Findings are consistent with normal middle ear function.  Hearing Evaluation The hearing test results were completed under headphones and re-checked with inserts and results are deemed to be of good reliability. Test technique:  conventional    Pure tone Audiometry: Both ears- Normal to moderately severe sensorineural hearing loss from 125 Hz - 8000 Hz.   Speech Audiometry: Right ear- Speech Reception Threshold (SRT) was obtained at 20 dBHL. Left ear-Speech Reception Threshold (SRT) was obtained at 15 dBHL.   Word Recognition  Score Tested using NU-6 (recorded) Right ear: 100% was obtained at a presentation level of 70 dBHL with contralateral masking which is deemed as  excellent. Left ear: 96% was obtained at a presentation level of 75 dBHL with contralateral masking which is deemed as  excellent.   Impression: Please look at the audiogram - some slight hearing asymmetry noted.  Recommendations: Follow up with ENT as scheduled. Return for a hearing evaluation if concerns with hearing changes arise or per MD recommendation. Consider various tinnitus strategies, including the use of a sound generator, hearing aids, and/or tinnitus retraining therapy.  Consider a communication needs assessment for amplification after medical clearance is obtained, if needed.   Jalise Zawistowski MARIE LEROUX-MARTINEZ, AUD  "

## 2024-09-11 NOTE — Progress Notes (Signed)
 Dear Dr. Stephane, Here is my assessment for our mutual patient, Charles Salas. Thank you for allowing me the opportunity to care for your patient. Please do not hesitate to contact me should you have any other questions. Sincerely, Chyrl Cohen PA-C  Otolaryngology Clinic Note Referring provider: Dr. Stephane HPI:  Charles Salas is a 79 y.o. male kindly referred by Dr. Stephane    Visit conducted via real-time audio/video telehealth platform. Patient location: [place] . Provider location: [place] . Patient identity verified. Consent for telehealth obtained. History and exam performed within limitations of telehealth. Total time spent: 5 mintues  I spoke with Charles Salas today.  I reviewed his audiological evaluation he has sensorineural hearing loss there is minimal asymmetry at 2 frequencies noting approximately 15 dB difference at 3000, 15 dB at 4000.  He denies any other changes today.    Independent Review of Additional Tests or Records:  Audiological evaluation 09/11/2024     PMH/Meds/All/SocHx/FamHx/ROS:   Past Medical History:  Diagnosis Date   Arthritis    bilateral knees   Corns and callosities    Dysphagia    Elevated liver enzymes 12/29/2014   Fracture of scapula, right, closed 2013   GERD (gastroesophageal reflux disease)    Headache(784.0)    Hyperlipidemia    Hypertension    Prostate cancer (HCC)    PSA elevation 12/29/2014   RBBB (right bundle branch block)    Sleep apnea      Past Surgical History:  Procedure Laterality Date   CYSTOSCOPY  11/13/2018   Procedure: CYSTOSCOPY;  Surgeon: Renda Glance, MD;  Location: [facility];  Service: Urology;;   Left knee arthroscopy Left 1985   Dr. Amy   left knee arthroscopy Left 1995   Dr. Amy   left shoulder surgery Left 2003   shaved collar bone; Dr. Amy   PROSTATE BIOPSY     RADIOACTIVE SEED IMPLANT N/A 11/13/2018   Procedure: RADIOACTIVE SEED IMPLANT/BRACHYTHERAPY IMPLANT;  Surgeon: Renda Glance, MD;  Location: [facility];  Service: Urology;  Laterality: N/A;  ONLY NEED 90 MIN FOR BOTH PROCEDURES   Right knee arthroscopy Right 1009   Dr. 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 Shari   SPACE OAR INSTILLATION N/A 11/13/2018   Procedure: SPACE OAR INSTILLATION;  Surgeon: Renda Glance, MD;  Location: [facility];  Service: Urology;  Laterality: N/A;    Family History  Problem Relation Age of Onset   Prostate cancer Father 72   Diabetes Father    Diabetes Mother    Stroke Mother    Breast cancer Sister    Colon cancer Neg Hx    Pancreatic cancer Neg Hx      Social Connections: Not on file     Current Medications[1]   Physical Exam:   There were no vitals taken for this visit.  Pertinent Findings  Telephone visit  History and exam performed within limitations of telehealth. Patient speaking in full sentences in no acute distress     Seprately Identifiable Procedures:  None  Impression & Plans:  Charles Salas is a 79 y.o. male with the following    I spoke with Charles Salas today.  I reviewed his audiological evaluation he has sensorineural hearing loss there is minimal asymmetry at 2 frequencies noting approximately 15 dB difference at 3000, 15 dB at 4000.  I do think it is reasonable to watch this with a repeat audiogram  in 1 year unless he has any changes in the meantime.  He will pursue hearing aids.  He denies any other concerns today.  - f/u 1 year with repeat audiological evaluation sooner as needed    Thank you for allowing me the opportunity to care for your patient. Please do not hesitate to contact me should you have any other questions.  Sincerely, Chyrl Cohen PA-C [facility] Phone: [phone] Fax: [phone]  09/11/2024, 11:55 AM      [1]  Current Outpatient Medications:    lisinopril (PRINIVIL,ZESTRIL) 10 MG tablet, Take 20 mg by mouth daily. , Disp: , Rfl:     rosuvastatin (CRESTOR) 5 MG tablet, Take 5 mg by mouth daily., Disp: , Rfl:

## 2024-09-11 NOTE — Progress Notes (Signed)
 Dear Dr. Stephane, Here is my assessment for our mutual patient, Charles Salas. Thank you for allowing me the opportunity to care for your patient. Please do not hesitate to contact me should you have any other questions. Sincerely, Chyrl Cohen PA-C  Otolaryngology Clinic Note Referring provider: Dr. Stephane HPI:  Charles Salas is a 79 y.o. male kindly referred by Dr. Stephane   Discussed the use of AI scribe software for clinical note transcription with the patient, who gave verbal consent to proceed.  History of Present Illness   Charles Salas is a 79 year old male with progressive bilateral hearing loss who presents for evaluation of worsening hearing and consideration of hearing aids.  He reports a gradual decline in hearing over several years, affecting both ears without significant lateralization. He has particular difficulty with high-frequency sounds and is able to selectively attend to specific noises, such as the television, while excluding other sounds. His wife has observed that he does not consistently respond unless she has his attention, though he does not find the hearing loss personally bothersome. 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  He experiences chronic, non-bothersome tinnitus, which he is generally able to ignore.  He has undergone multiple audiograms, including in 2018 and 2023. Neither ear was previously considered severe enough to warrant hearing aids. He has not used hearing aids but trialed ear pipes, which resulted in marked improvement in hearing, particularly for high-frequency and environmental sounds. He notes more frequent cerumen accumulation in the right ear compared to the left.           Independent Review of Additional Tests or Records:  none   PMH/Meds/All/SocHx/FamHx/ROS:   Past Medical History:  Diagnosis Date   Arthritis    bilateral  knees   Corns and callosities    Dysphagia    Elevated liver enzymes 12/29/2014   Fracture of scapula, right, closed 2013   GERD (gastroesophageal reflux disease)    Headache(784.0)    Hyperlipidemia    Hypertension    Prostate cancer (HCC)    PSA elevation 12/29/2014   RBBB (right bundle branch block)    Sleep apnea      Past Surgical History:  Procedure Laterality Date   CYSTOSCOPY  11/13/2018   Procedure: CYSTOSCOPY;  Surgeon: Renda Glance, MD;  Location: [facility];  Service: Urology;;   Left knee arthroscopy Left 1985   Dr. Amy   left knee arthroscopy Left 1995   Dr. Amy   left shoulder surgery Left 2003   shaved collar bone; Dr. Amy   PROSTATE BIOPSY     RADIOACTIVE SEED IMPLANT N/A 11/13/2018   Procedure: RADIOACTIVE SEED IMPLANT/BRACHYTHERAPY IMPLANT;  Surgeon: Renda Glance, MD;  Location: [facility];  Service: Urology;  Laterality: N/A;  ONLY NEED 90 MIN FOR BOTH PROCEDURES   Right knee arthroscopy Right 1009   Dr. 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 Shari   SPACE OAR INSTILLATION N/A 11/13/2018   Procedure: SPACE OAR INSTILLATION;  Surgeon: Renda Glance, MD;  Location: [facility];  Service: Urology;  Laterality: N/A;    Family History  Problem Relation Age of Onset   Prostate cancer Father 36   Diabetes Father    Diabetes Mother    Stroke Mother    Breast cancer Sister    Colon  cancer Neg Hx    Pancreatic cancer Neg Hx      Social Connections: Not on file     Current Medications[1]   Physical Exam:   BP (!) 145/80   Pulse 61   Temp 98 F (36.7 C)   Ht 6' (1.829 m)   Wt 200 lb (90.7 kg)   SpO2 97%   BMI 27.12 kg/m   Pertinent Findings  CN II-XII grossly intact Bilateral EAC clear and TM intact with well pneumatized middle ear spaces Anterior rhinoscopy: Septum midline; bilateral inferior turbinates with no hypertrophy No lesions of oral cavity/oropharynx; dentition  wnl No obviously palpable neck masses/lymphadenopathy/thyromegaly No respiratory distress or stridor       Seprately Identifiable Procedures:  None  Impression & Plans:  Charles Salas is a 79 y.o. male with the following   Assessment and Plan    Bilateral sensorineural hearing loss Chronic, progressive bilateral sensorineural hearing loss with high-frequency involvement and prior noise exposure.   - Ordered formal audiogram - Scheduled follow-up telephone visit to review audiogram results and discuss management, including potential hearing aids. - Discussed that persistent significant asymmetry may warrant further investigation.  Tinnitus Chronic, non-bothersome tinnitus. No significant impact on quality of life.           - f/u Phone office visit with audio    Thank you for allowing me the opportunity to care for your patient. Please do not hesitate to contact me should you have any other questions.  Sincerely, Chyrl Cohen PA-C [facility] Phone: [phone] Fax: [phone]  09/11/2024, 8:38 AM        [1]  Current Outpatient Medications:    lisinopril (PRINIVIL,ZESTRIL) 10 MG tablet, Take 20 mg by mouth daily. , Disp: , Rfl:    rosuvastatin (CRESTOR) 5 MG tablet, Take 5 mg by mouth daily., Disp: , Rfl:
